# Patient Record
Sex: Female | Born: 1950
Health system: Southern US, Community
[De-identification: ages and names within clinical notes are randomized; demographics above are authoritative.]

## PROBLEM LIST (undated history)

## (undated) DIAGNOSIS — R7303 Prediabetes: Secondary | ICD-10-CM

## (undated) DIAGNOSIS — E785 Hyperlipidemia, unspecified: Secondary | ICD-10-CM

## (undated) DIAGNOSIS — M1990 Unspecified osteoarthritis, unspecified site: Secondary | ICD-10-CM

## (undated) DIAGNOSIS — Z789 Other specified health status: Secondary | ICD-10-CM

## (undated) HISTORY — DX: Unspecified osteoarthritis, unspecified site: M19.90

## (undated) HISTORY — DX: Hyperlipidemia, unspecified: E78.5

## (undated) HISTORY — DX: Prediabetes: R73.03

## (undated) HISTORY — DX: Other specified health status: Z78.9

---

## 1997-12-21 ENCOUNTER — Other Ambulatory Visit: Admission: RE | Admit: 1997-12-21 | Discharge: 1997-12-21 | Payer: Self-pay | Admitting: *Deleted

## 2003-05-23 ENCOUNTER — Encounter: Admission: RE | Admit: 2003-05-23 | Discharge: 2003-05-23 | Payer: Self-pay | Admitting: Family Medicine

## 2003-05-23 ENCOUNTER — Encounter: Payer: Self-pay | Admitting: Family Medicine

## 2003-11-07 ENCOUNTER — Ambulatory Visit (HOSPITAL_COMMUNITY): Admission: RE | Admit: 2003-11-07 | Discharge: 2003-11-07 | Payer: Self-pay | Admitting: Gastroenterology

## 2003-11-07 ENCOUNTER — Encounter (INDEPENDENT_AMBULATORY_CARE_PROVIDER_SITE_OTHER): Payer: Self-pay | Admitting: Specialist

## 2004-08-29 ENCOUNTER — Other Ambulatory Visit: Admission: RE | Admit: 2004-08-29 | Discharge: 2004-08-29 | Payer: Self-pay | Admitting: Family Medicine

## 2004-12-07 ENCOUNTER — Encounter: Admission: RE | Admit: 2004-12-07 | Discharge: 2004-12-07 | Payer: Self-pay | Admitting: Family Medicine

## 2005-01-21 ENCOUNTER — Encounter: Admission: RE | Admit: 2005-01-21 | Discharge: 2005-01-21 | Payer: Self-pay | Admitting: Emergency Medicine

## 2005-05-07 IMAGING — US US TRANSVAGINAL NON-OB
1 series · 14 of 25 positions shown · non-contrast
Comparison: none

CLINICAL DATA: 54-year-old, with post-menopausal bleeding.  Patient on hormone replacement therapy. 
 ULTRASOUND OF THE PELVIS COMPLETE AND ULTRASOUND TRANSVAGINAL NON-OB:
 The uterus measures 7.0 x 4.3 x 4.9 cm.  There is a probable fundal fibroid.  The endometrium measures approximately 4 mm which is normal in a post-menopausal person.  The right ovary measures 2.1 x 0.9 x 1.2 cm.  The left ovary measures 2.2 x 1.5 x 1.3 cm.  No free pelvic fluid collections are seen.

[Series 1: unknown · 0.23mm/px · 14 of 49 slices shown]
[im 1/49]
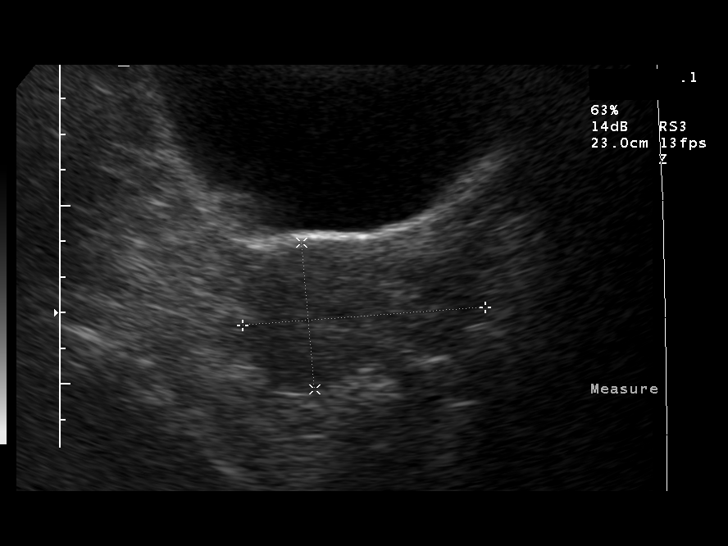
[im 5/49]
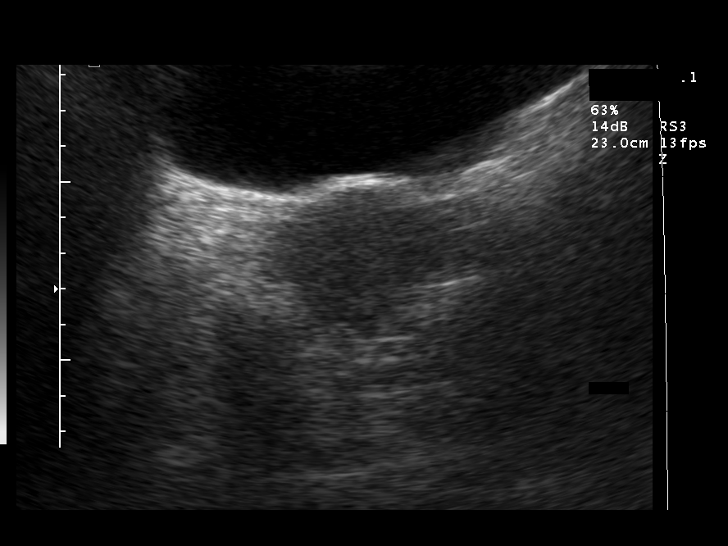
[im 9/49]
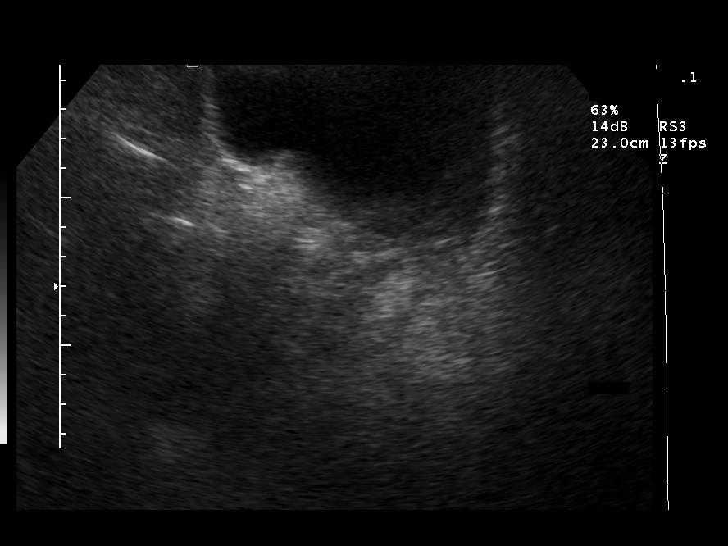
[im 13/49]
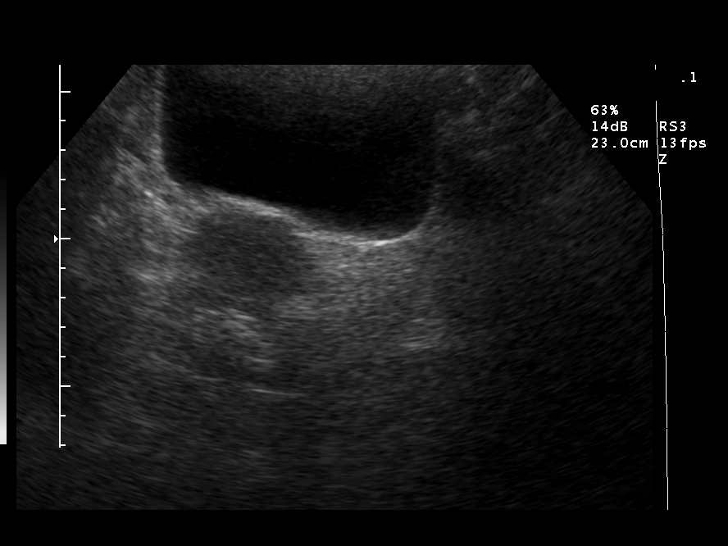
[im 17/49]
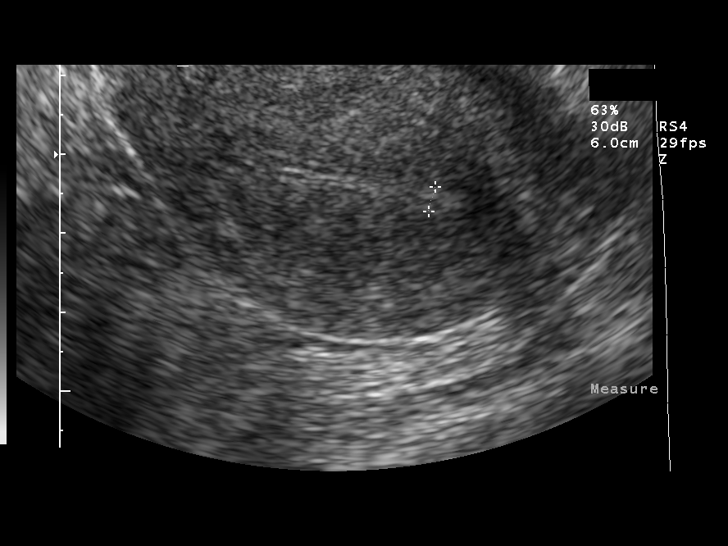
[im 19/49]
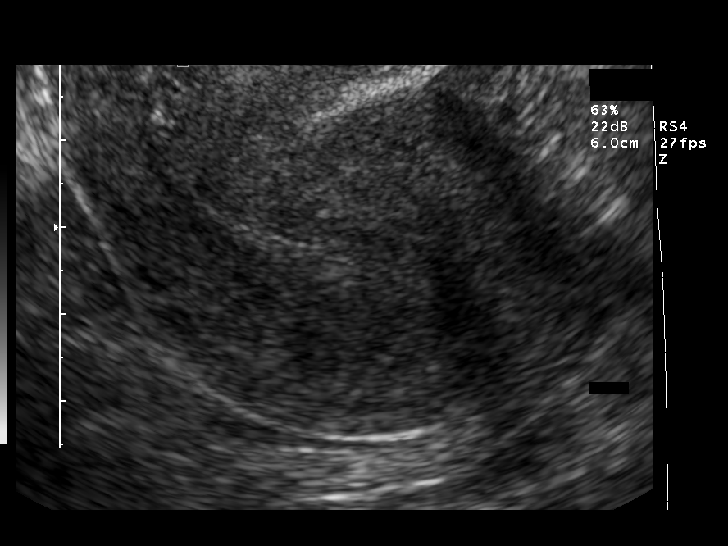
[im 23/49]
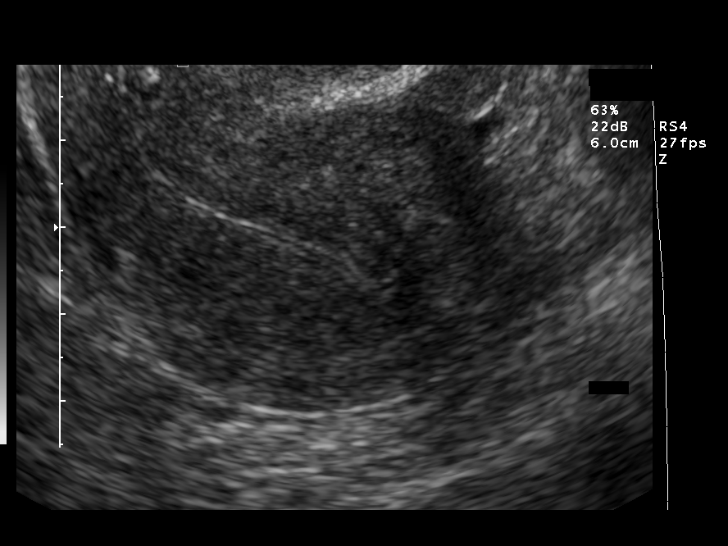
[im 27/49]
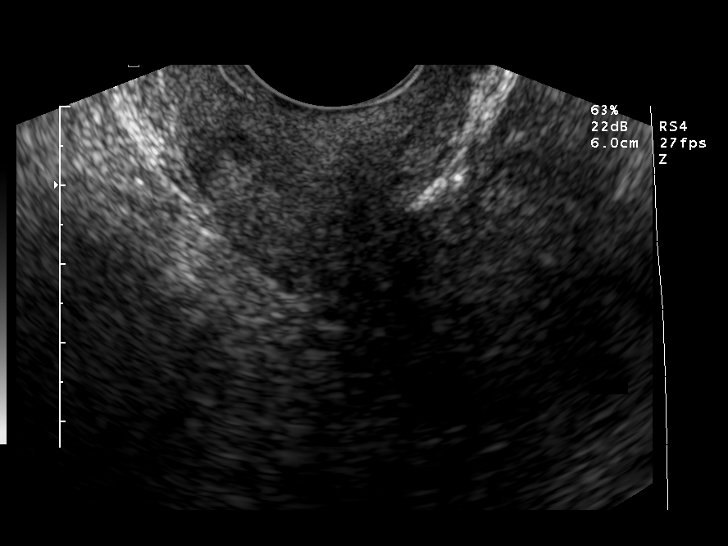
[im 31/49]
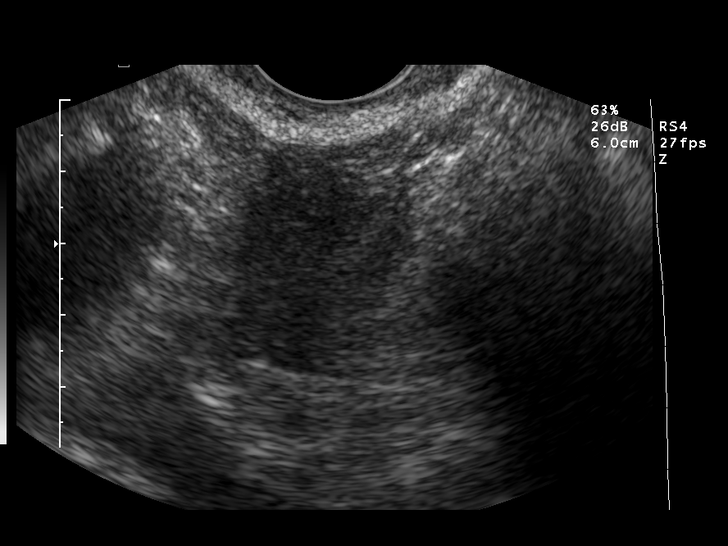
[im 33/49]
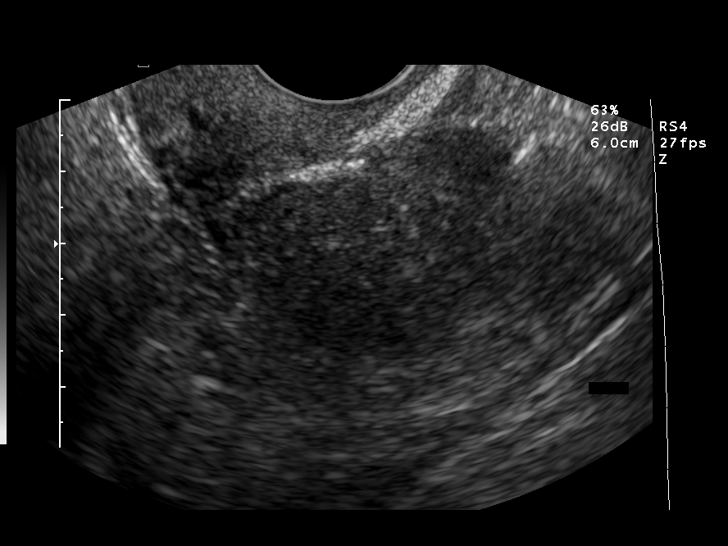
[im 37/49]
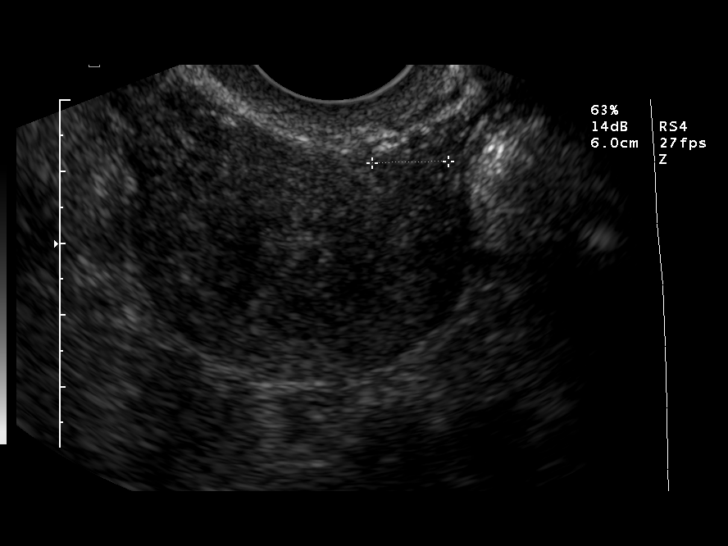
[im 41/49]
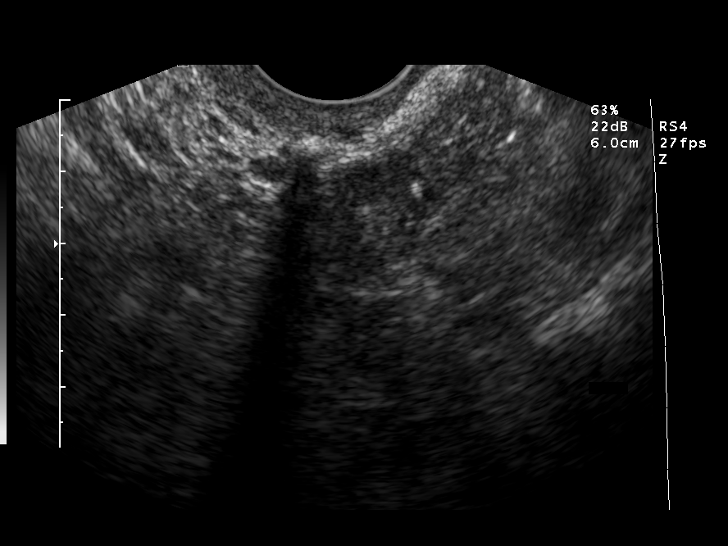
[im 45/49]
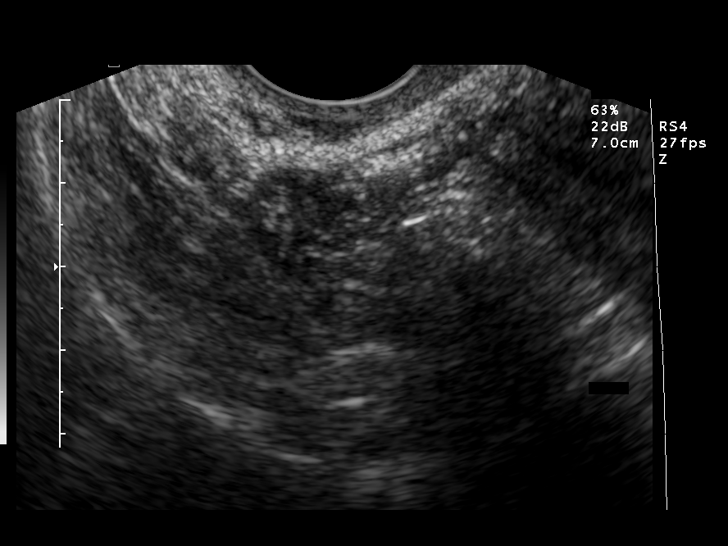
[im 49/49]
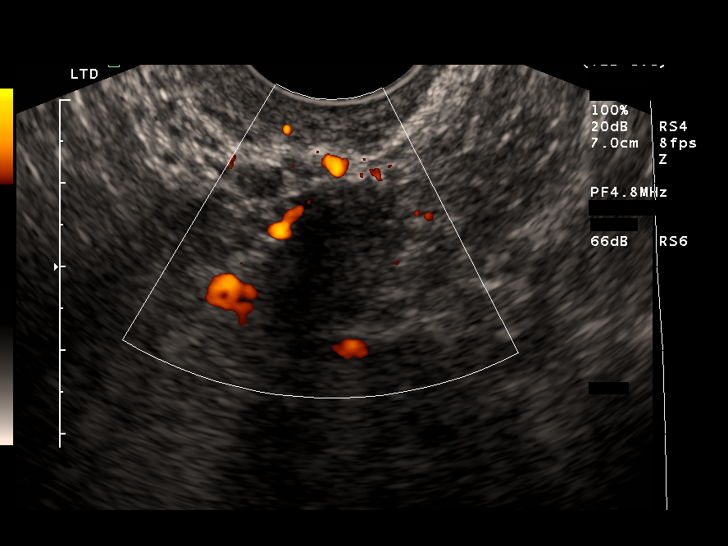

[14 of 25 positions shown; findings below may reference images not displayed]

IMPRESSION: 1.  Normal ultrasound appearance of the endometrium and normal thickness. 
 2.  Probable small fundal fibroid.
 3.  Normal sonographic appearance of the ovaries.

## 2005-06-21 IMAGING — CR DG ANKLE COMPLETE 3+V*R*
3 series · 3 of 3 positions shown · non-contrast
Comparison: none

CLINICAL DATA: Turned ankle over, pain and swelling medial ankle.
 RIGHT [id number] VIEWS:
 Three views of the right ankle reveal no evidence of fracture, dislocation or other acute bony abnormality.  Soft tissue swelling is noted in both malleolar regions, more striking on the lateral aspect.

[view not recorded (1 of 3)]
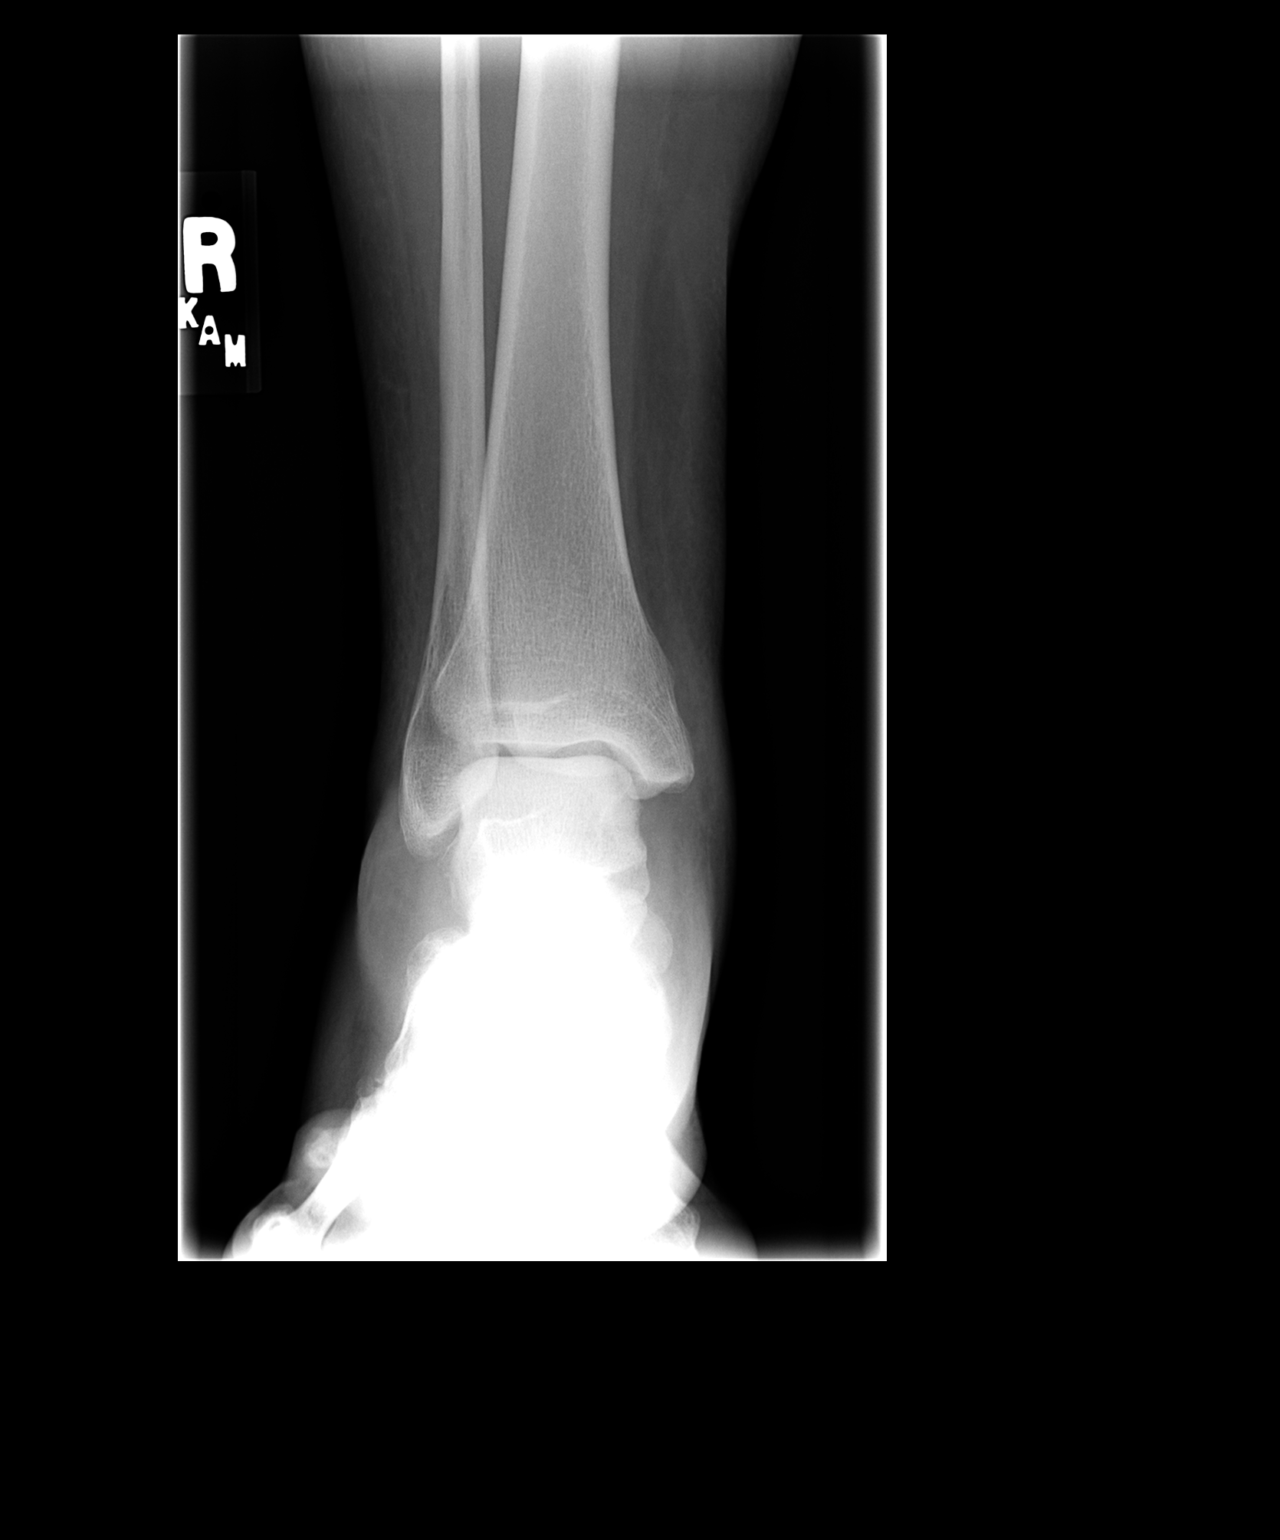

[view not recorded (2 of 3)]
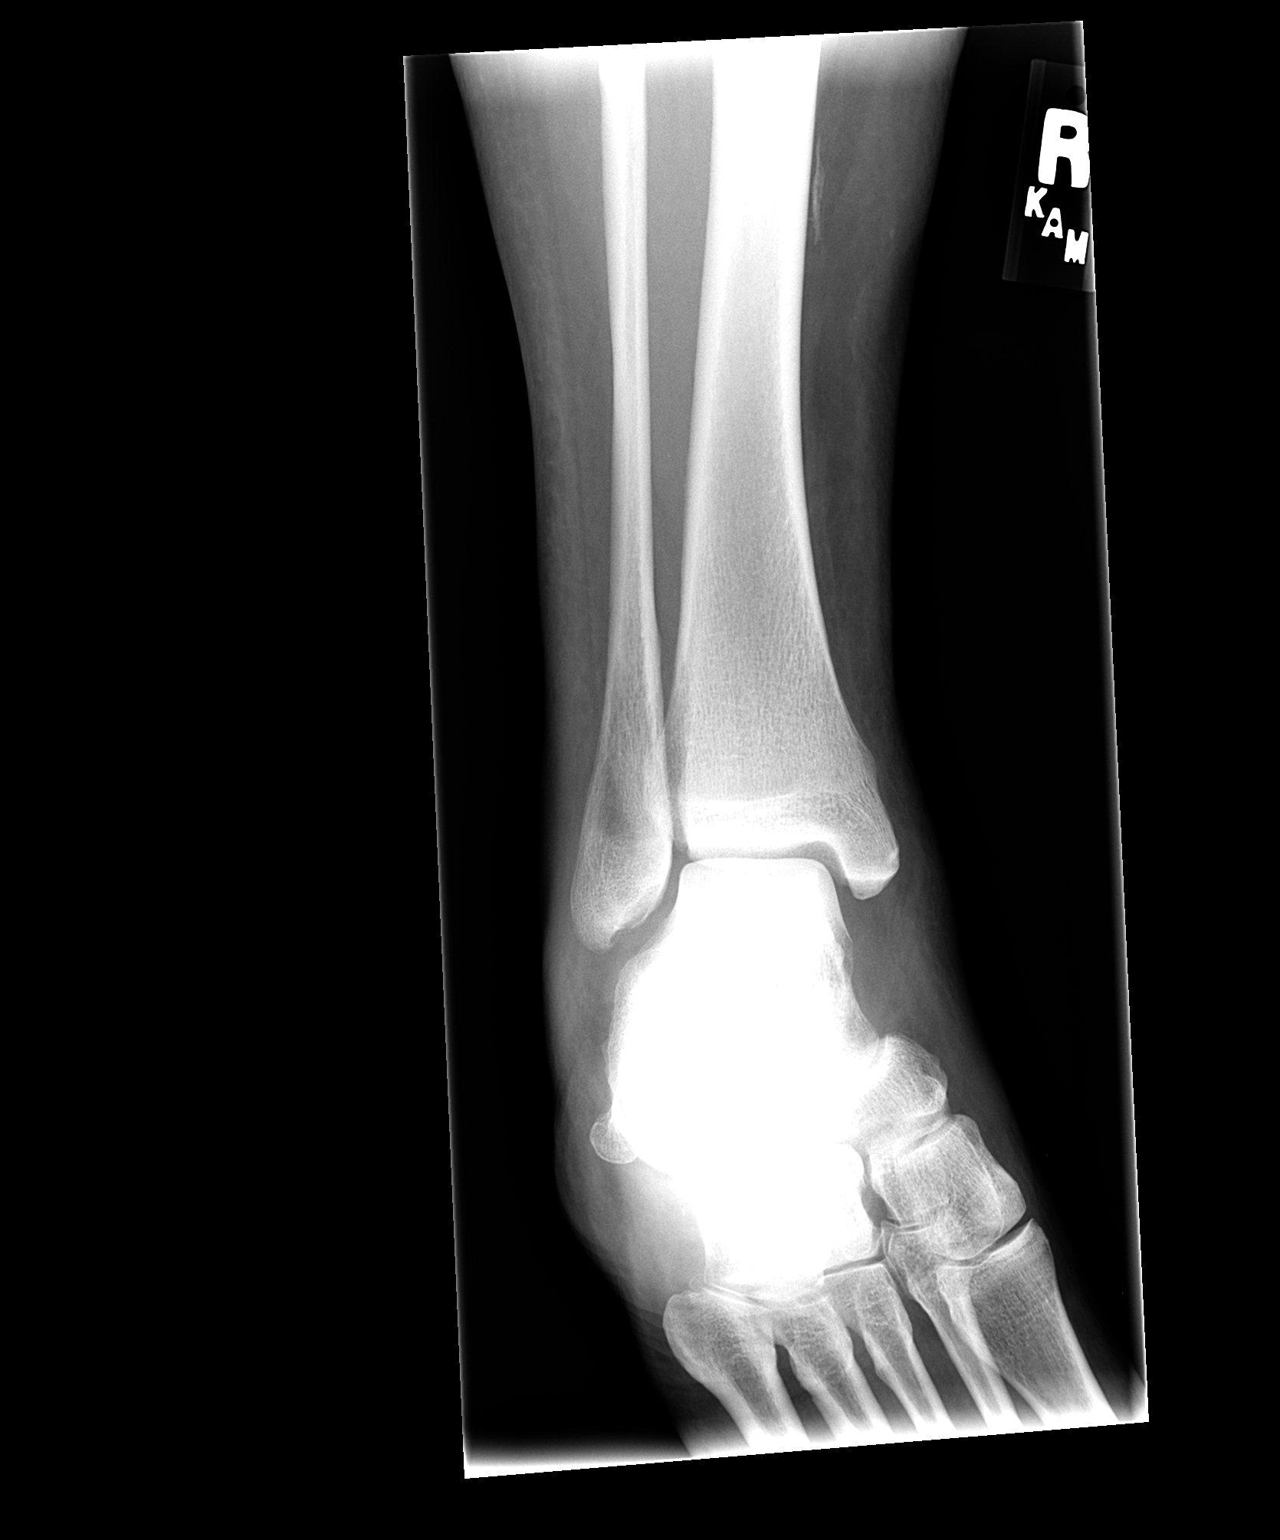

[view not recorded (3 of 3)]
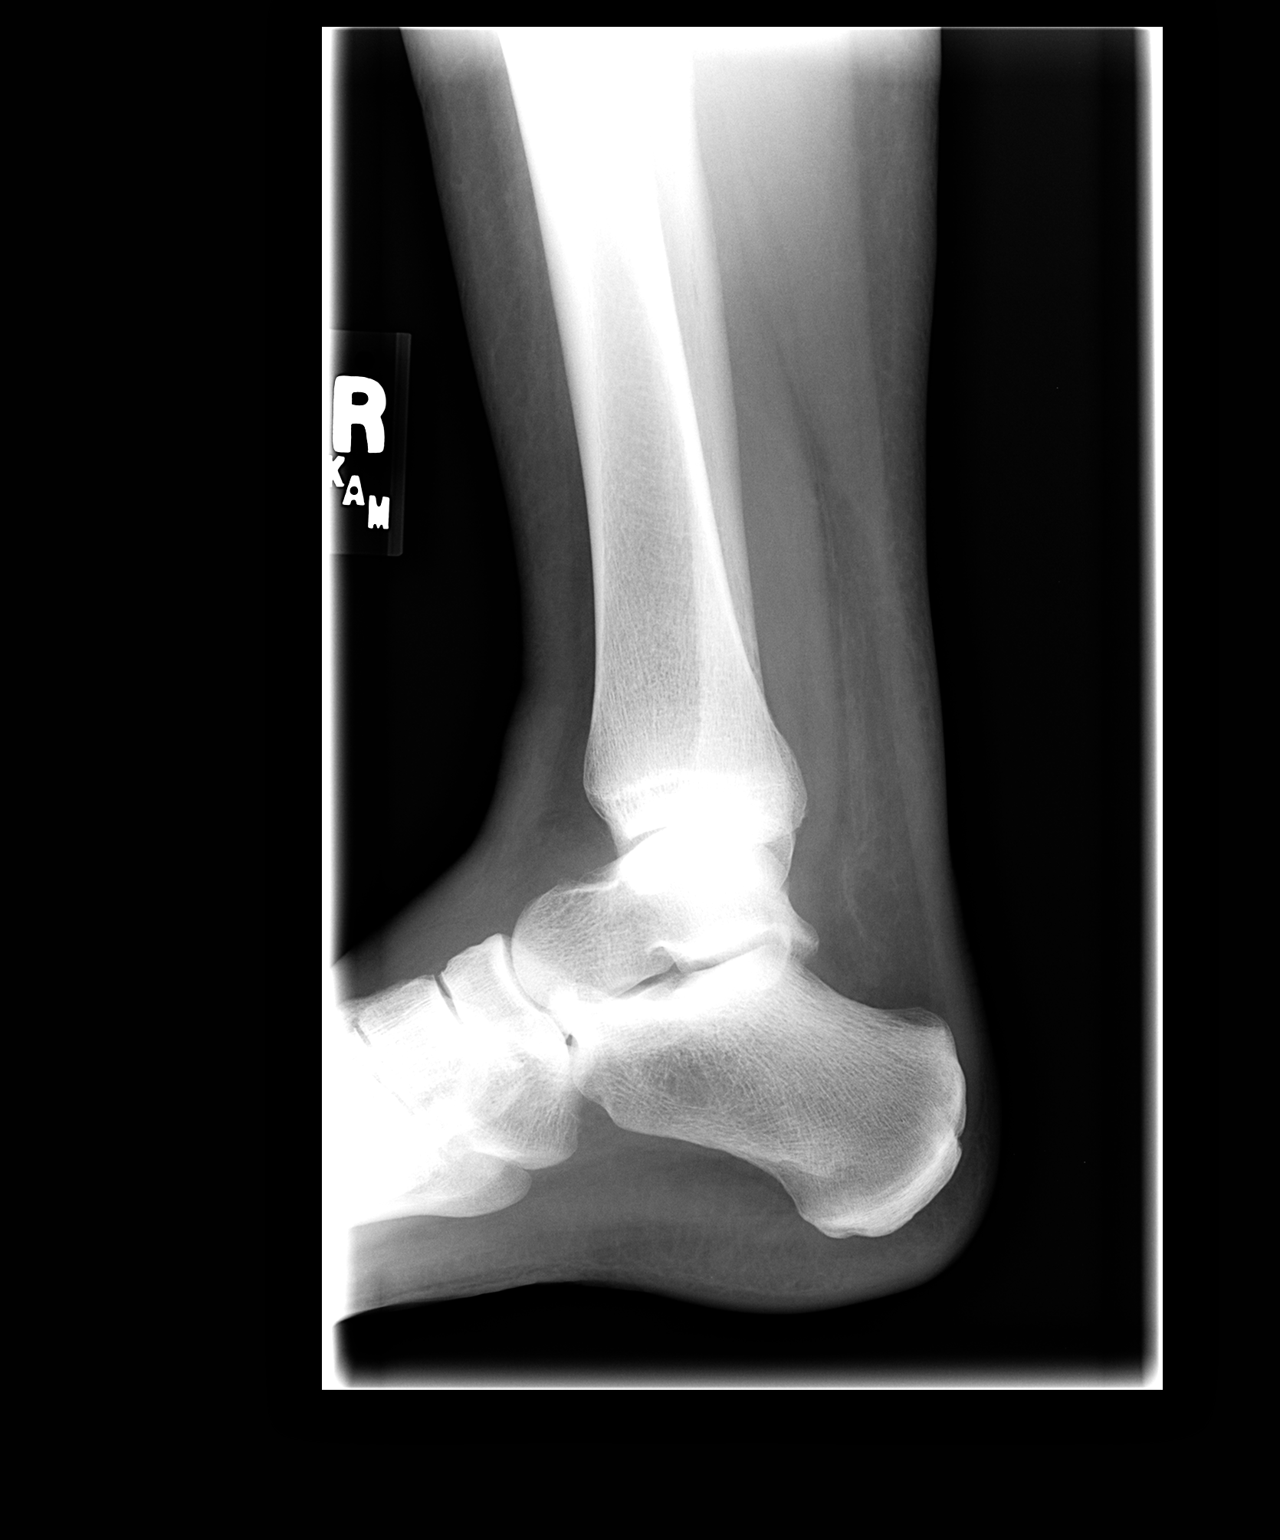

[3 of 3 positions shown; findings below may reference images not displayed]

IMPRESSION: No evidence of fracture is noted.  Soft tissue swelling is noted above.

## 2005-12-20 ENCOUNTER — Other Ambulatory Visit: Admission: RE | Admit: 2005-12-20 | Discharge: 2005-12-20 | Payer: Self-pay | Admitting: Family Medicine

## 2005-12-27 ENCOUNTER — Ambulatory Visit (HOSPITAL_COMMUNITY): Admission: RE | Admit: 2005-12-27 | Discharge: 2005-12-27 | Payer: Self-pay | Admitting: Family Medicine

## 2006-12-23 ENCOUNTER — Other Ambulatory Visit: Admission: RE | Admit: 2006-12-23 | Discharge: 2006-12-23 | Payer: Self-pay | Admitting: Family Medicine

## 2007-12-29 ENCOUNTER — Other Ambulatory Visit: Admission: RE | Admit: 2007-12-29 | Discharge: 2007-12-29 | Payer: Self-pay | Admitting: Family Medicine

## 2009-01-10 ENCOUNTER — Other Ambulatory Visit: Admission: RE | Admit: 2009-01-10 | Discharge: 2009-01-10 | Payer: Self-pay | Admitting: Family Medicine

## 2010-02-27 ENCOUNTER — Other Ambulatory Visit: Admission: RE | Admit: 2010-02-27 | Discharge: 2010-02-27 | Payer: Self-pay | Admitting: Family Medicine

## 2011-02-22 NOTE — Op Note (Signed)
NAME:  Marisa Wilson, Marisa Wilson                          ACCOUNT NO.:  [id number]   MEDICAL RECORD NO.:  [id number]                   PATIENT TYPE:  AMB   LOCATION:  ENDO                                 FACILITY:  [facility]   PHYSICIAN:  Graylin Shiver, M.D.                DATE OF BIRTH:  1951/03/14   DATE OF PROCEDURE:  11/07/2003  DATE OF DISCHARGE:                                 OPERATIVE REPORT   PROCEDURE:  Colonoscopy with biopsy.   INDICATIONS:  Screening.   Informed consent was obtained after explanation of the risks of bleeding,  infection, and perforation.   PREMEDICATION:  Fentanyl 50 mcg IV, Versed 8 mg IV.   DESCRIPTION OF PROCEDURE:  With the patient in the left lateral decubitus  position, rectal exam was performed.  No masses were felt.  The Olympus  colonoscope was inserted into the rectum and advanced around a very  tortuous, long colon to the cecum.  The patient had to be rolled on her  right side in order to achieve intubation of the cecum.  Cecal landmarks  were identified.  The cecum looked normal.  In the proximal ascending colon  there was a 4 mm sessile polyp removed with cold forceps.  The transverse  colon looked normal.  The descending colon, sigmoid, and rectum looked  normal.  She tolerated the procedure well without complications.   IMPRESSION:  Small ascending colon polyp, diagnosis code 211.3.   PLAN:  The pathology will be checked.  If this was an adenomatous polyp, I  would recommend a follow-up surveillance colonoscopy again in five years.                                               Graylin Shiver, M.D.    [name]  D:  11/07/2003  T:  11/07/2003  Job:  [id number]   cc:   Otilio Connors. Gerri Spore, M.D.  [address]  [place]  [place] [address]  Fax: [phone]

## 2012-03-06 ENCOUNTER — Other Ambulatory Visit (HOSPITAL_COMMUNITY)
Admission: RE | Admit: 2012-03-06 | Discharge: 2012-03-06 | Disposition: A | Discharge status: Home / Self Care | Payer: No Typology Code available for payment source | Source: Ambulatory Visit | Attending: Family Medicine | Admitting: Family Medicine

## 2012-03-06 ENCOUNTER — Other Ambulatory Visit: Payer: Self-pay | Admitting: Family Medicine

## 2012-03-06 DIAGNOSIS — Z124 Encounter for screening for malignant neoplasm of cervix: Secondary | ICD-10-CM | POA: Insufficient documentation

## 2012-06-16 ENCOUNTER — Ambulatory Visit (HOSPITAL_COMMUNITY)
Admission: RE | Admit: 2012-06-16 | Discharge: 2012-06-16 | Disposition: A | Discharge status: Home / Self Care | Payer: No Typology Code available for payment source | Source: Ambulatory Visit | Attending: Family Medicine | Admitting: Family Medicine

## 2012-06-16 DIAGNOSIS — Z823 Family history of stroke: Secondary | ICD-10-CM | POA: Insufficient documentation

## 2012-06-16 DIAGNOSIS — R51 Headache: Secondary | ICD-10-CM | POA: Insufficient documentation

## 2012-06-16 DIAGNOSIS — R0989 Other specified symptoms and signs involving the circulatory and respiratory systems: Secondary | ICD-10-CM

## 2012-06-16 DIAGNOSIS — I6529 Occlusion and stenosis of unspecified carotid artery: Secondary | ICD-10-CM | POA: Insufficient documentation

## 2012-06-16 NOTE — Progress Notes (Signed)
Bilateral:  No evidence of hemodynamically significant internal carotid artery stenosis.   Vertebral artery flow is antegrade.     

## 2016-03-18 ENCOUNTER — Other Ambulatory Visit: Payer: Self-pay | Admitting: Family Medicine

## 2016-03-18 ENCOUNTER — Other Ambulatory Visit (HOSPITAL_COMMUNITY)
Admission: RE | Admit: 2016-03-18 | Discharge: 2016-03-18 | Disposition: A | Discharge status: Home / Self Care | Payer: PRIVATE HEALTH INSURANCE | Source: Ambulatory Visit | Attending: Family Medicine | Admitting: Family Medicine

## 2016-03-18 DIAGNOSIS — Z01411 Encounter for gynecological examination (general) (routine) with abnormal findings: Secondary | ICD-10-CM | POA: Insufficient documentation

## 2016-03-18 DIAGNOSIS — Z1151 Encounter for screening for human papillomavirus (HPV): Secondary | ICD-10-CM | POA: Diagnosis present

## 2016-03-20 LAB — CYTOLOGY - PAP

## 2018-04-15 DIAGNOSIS — Z23 Encounter for immunization: Secondary | ICD-10-CM | POA: Diagnosis not present

## 2018-04-15 DIAGNOSIS — R69 Illness, unspecified: Secondary | ICD-10-CM | POA: Diagnosis not present

## 2018-04-15 DIAGNOSIS — Z0000 Encounter for general adult medical examination without abnormal findings: Secondary | ICD-10-CM | POA: Diagnosis not present

## 2018-04-15 DIAGNOSIS — Z5181 Encounter for therapeutic drug level monitoring: Secondary | ICD-10-CM | POA: Diagnosis not present

## 2018-04-15 DIAGNOSIS — E538 Deficiency of other specified B group vitamins: Secondary | ICD-10-CM | POA: Diagnosis not present

## 2018-04-15 DIAGNOSIS — E785 Hyperlipidemia, unspecified: Secondary | ICD-10-CM | POA: Diagnosis not present

## 2018-05-19 DIAGNOSIS — E2839 Other primary ovarian failure: Secondary | ICD-10-CM | POA: Diagnosis not present

## 2018-07-20 DIAGNOSIS — R69 Illness, unspecified: Secondary | ICD-10-CM | POA: Diagnosis not present

## 2018-09-10 DIAGNOSIS — D485 Neoplasm of uncertain behavior of skin: Secondary | ICD-10-CM | POA: Diagnosis not present

## 2018-09-10 DIAGNOSIS — L821 Other seborrheic keratosis: Secondary | ICD-10-CM | POA: Diagnosis not present

## 2018-09-10 DIAGNOSIS — L814 Other melanin hyperpigmentation: Secondary | ICD-10-CM | POA: Diagnosis not present

## 2018-09-10 DIAGNOSIS — D0471 Carcinoma in situ of skin of right lower limb, including hip: Secondary | ICD-10-CM | POA: Diagnosis not present

## 2018-09-10 DIAGNOSIS — B078 Other viral warts: Secondary | ICD-10-CM | POA: Diagnosis not present

## 2018-09-10 DIAGNOSIS — D1801 Hemangioma of skin and subcutaneous tissue: Secondary | ICD-10-CM | POA: Diagnosis not present

## 2018-09-10 DIAGNOSIS — L570 Actinic keratosis: Secondary | ICD-10-CM | POA: Diagnosis not present

## 2018-11-16 DIAGNOSIS — Z1231 Encounter for screening mammogram for malignant neoplasm of breast: Secondary | ICD-10-CM | POA: Diagnosis not present

## 2019-02-17 DIAGNOSIS — H524 Presbyopia: Secondary | ICD-10-CM | POA: Diagnosis not present

## 2019-04-22 DIAGNOSIS — D485 Neoplasm of uncertain behavior of skin: Secondary | ICD-10-CM | POA: Diagnosis not present

## 2019-04-22 DIAGNOSIS — L565 Disseminated superficial actinic porokeratosis (DSAP): Secondary | ICD-10-CM | POA: Diagnosis not present

## 2019-04-22 DIAGNOSIS — D1801 Hemangioma of skin and subcutaneous tissue: Secondary | ICD-10-CM | POA: Diagnosis not present

## 2019-04-22 DIAGNOSIS — L570 Actinic keratosis: Secondary | ICD-10-CM | POA: Diagnosis not present

## 2019-04-22 DIAGNOSIS — L920 Granuloma annulare: Secondary | ICD-10-CM | POA: Diagnosis not present

## 2019-04-22 DIAGNOSIS — D225 Melanocytic nevi of trunk: Secondary | ICD-10-CM | POA: Diagnosis not present

## 2019-04-22 DIAGNOSIS — Z85828 Personal history of other malignant neoplasm of skin: Secondary | ICD-10-CM | POA: Diagnosis not present

## 2019-04-22 DIAGNOSIS — L821 Other seborrheic keratosis: Secondary | ICD-10-CM | POA: Diagnosis not present

## 2019-05-12 DIAGNOSIS — Z5181 Encounter for therapeutic drug level monitoring: Secondary | ICD-10-CM | POA: Diagnosis not present

## 2019-05-12 DIAGNOSIS — R7303 Prediabetes: Secondary | ICD-10-CM | POA: Diagnosis not present

## 2019-05-12 DIAGNOSIS — E538 Deficiency of other specified B group vitamins: Secondary | ICD-10-CM | POA: Diagnosis not present

## 2019-05-12 DIAGNOSIS — E785 Hyperlipidemia, unspecified: Secondary | ICD-10-CM | POA: Diagnosis not present

## 2019-05-14 DIAGNOSIS — Z0000 Encounter for general adult medical examination without abnormal findings: Secondary | ICD-10-CM | POA: Diagnosis not present

## 2019-05-14 DIAGNOSIS — Z1211 Encounter for screening for malignant neoplasm of colon: Secondary | ICD-10-CM | POA: Diagnosis not present

## 2019-05-14 DIAGNOSIS — R69 Illness, unspecified: Secondary | ICD-10-CM | POA: Diagnosis not present

## 2019-06-01 DIAGNOSIS — Z23 Encounter for immunization: Secondary | ICD-10-CM | POA: Diagnosis not present

## 2019-06-13 DIAGNOSIS — L247 Irritant contact dermatitis due to plants, except food: Secondary | ICD-10-CM | POA: Diagnosis not present

## 2019-10-26 DIAGNOSIS — L738 Other specified follicular disorders: Secondary | ICD-10-CM | POA: Diagnosis not present

## 2019-10-26 DIAGNOSIS — D225 Melanocytic nevi of trunk: Secondary | ICD-10-CM | POA: Diagnosis not present

## 2019-10-26 DIAGNOSIS — D1801 Hemangioma of skin and subcutaneous tissue: Secondary | ICD-10-CM | POA: Diagnosis not present

## 2019-10-26 DIAGNOSIS — L814 Other melanin hyperpigmentation: Secondary | ICD-10-CM | POA: Diagnosis not present

## 2019-10-26 DIAGNOSIS — L570 Actinic keratosis: Secondary | ICD-10-CM | POA: Diagnosis not present

## 2019-10-26 DIAGNOSIS — L821 Other seborrheic keratosis: Secondary | ICD-10-CM | POA: Diagnosis not present

## 2019-10-26 DIAGNOSIS — D2239 Melanocytic nevi of other parts of face: Secondary | ICD-10-CM | POA: Diagnosis not present

## 2019-12-02 DIAGNOSIS — Z1231 Encounter for screening mammogram for malignant neoplasm of breast: Secondary | ICD-10-CM | POA: Diagnosis not present

## 2019-12-10 DIAGNOSIS — R922 Inconclusive mammogram: Secondary | ICD-10-CM | POA: Diagnosis not present

## 2020-02-20 DIAGNOSIS — S80861A Insect bite (nonvenomous), right lower leg, initial encounter: Secondary | ICD-10-CM | POA: Diagnosis not present

## 2020-02-20 DIAGNOSIS — W57XXXA Bitten or stung by nonvenomous insect and other nonvenomous arthropods, initial encounter: Secondary | ICD-10-CM | POA: Diagnosis not present

## 2020-02-20 DIAGNOSIS — L309 Dermatitis, unspecified: Secondary | ICD-10-CM | POA: Diagnosis not present

## 2020-04-28 ENCOUNTER — Ambulatory Visit: Payer: Self-pay | Admitting: Podiatry

## 2020-05-04 ENCOUNTER — Other Ambulatory Visit: Payer: Self-pay

## 2020-05-04 ENCOUNTER — Ambulatory Visit (INDEPENDENT_AMBULATORY_CARE_PROVIDER_SITE_OTHER): Payer: Medicare HMO

## 2020-05-04 ENCOUNTER — Encounter: Payer: Self-pay | Admitting: Podiatry

## 2020-05-04 ENCOUNTER — Ambulatory Visit: Payer: Medicare HMO | Admitting: Podiatry

## 2020-05-04 DIAGNOSIS — Q828 Other specified congenital malformations of skin: Secondary | ICD-10-CM

## 2020-05-04 DIAGNOSIS — Q667 Congenital pes cavus, unspecified foot: Secondary | ICD-10-CM

## 2020-05-04 DIAGNOSIS — M2042 Other hammer toe(s) (acquired), left foot: Secondary | ICD-10-CM | POA: Diagnosis not present

## 2020-05-04 DIAGNOSIS — M2041 Other hammer toe(s) (acquired), right foot: Secondary | ICD-10-CM

## 2020-05-04 NOTE — Progress Notes (Signed)
Subjective:  Patient ID: Marisa Wilson, female    DOB: Nov 16, 1950,  MRN: [id number]  Chief Complaint  Patient presents with  . Callouses    Painful callouses - L plantar forefoot submet 1 and 5, R plantar forefoot submet 1, 3, and 5.  . Nail Problem    Thick, long toenails. Requests nail trim.  . Foot Problem    Bilateral - pt wonders if she has hammertoes. They are not painful, but she thinks she has developed corns on R 4th toe and L 2nd toe.    69 y.o. female presents with the above complaint.  Patient presents with complaint of thickened elongated dystrophic toenails as well as benign skin lesions/porokeratosis to bilateral 1 and 5 lesions.  Patient states is painful to ambulate on.  Patient has not tried anything for management of it.  She has not seen anyone else prior to seeing me.  She would like to discuss treatment options.  She also complains of very high arch foot leading to pressure in the forefoot/ball of the foot.  Pain scale 7 out of 10.  Is dull achy in nature.   Review of Systems: Negative except as noted in the HPI. Denies N/V/F/Ch.  No past medical history on file.  Current Outpatient Medications:  .  citalopram (CELEXA) 20 MG tablet, Take 20 mg by mouth daily., Disp: , Rfl:  .  pravastatin (PRAVACHOL) 20 MG tablet, Take 20 mg by mouth daily., Disp: , Rfl:   Social History   Tobacco Use  Smoking Status Not on file    Allergies  Allergen Reactions  . Codeine Nausea And Vomiting   Objective:  There were no vitals filed for this visit. There is no height or weight on file to calculate BMI. Constitutional Well developed. Well nourished.  Vascular Dorsalis pedis pulses palpable bilaterally. Posterior tibial pulses palpable bilaterally. Capillary refill normal to all digits.  No cyanosis or clubbing noted. Pedal hair growth normal.  Neurologic Normal speech. Oriented to person, place, and time. Epicritic sensation to light touch grossly present  bilaterally.  Dermatologic Nails well groomed and normal in appearance. No open wounds. No skin lesions.  Orthopedic:  Pain on palpation to the hyperkeratotic lesions bilateral 1 and 5.  Cavus foot structure noted with plantarflexion of the metatarsals and dorsiflexion of the digits with hammertoe contractures of 2 through 5 bilaterally   Radiographs: 3 views of skeletally mature adult bilateral views: Bullet hole sinus tarsi noted.  There is increasing calcaneal inclination angle increase in talar declination angle.  Posterior break in the cyma line.  Hammertoe contractures noted bilaterally 1 through 5.  No arthritic changes noted. Assessment:   1. Hammertoes of both feet   2. Pes cavus    Plan:  Patient was evaluated and treated and all questions answered.  Porokeratosis bilateral submetatarsal 1 and 5 with underlying pes cavus foot structure -I explained to the patient the etiology of callus formation and various treatment options were extensively discussed with the patient.  Given that patient has pes cavus foot structure putting excessive pressure in the ball of the foot is the etiology of the hyperkeratotic lesions.  I explained this to the patient extensive detail.  I believe patient will benefit from custom-made orthotics to help address and support the arch of the foot control the hindfoot motion and offload the pressure sensitive callus formation to bilateral 1 and 5 and right third submetatarsal. -He will be scheduled see rec for custom-made orthotics with offloading  as described above.  Return for [employer] with Marisa Wilson for [employer].

## 2020-05-11 ENCOUNTER — Other Ambulatory Visit: Payer: Self-pay

## 2020-05-11 ENCOUNTER — Ambulatory Visit (INDEPENDENT_AMBULATORY_CARE_PROVIDER_SITE_OTHER): Payer: Medicare HMO | Admitting: Orthotics

## 2020-05-11 DIAGNOSIS — M2041 Other hammer toe(s) (acquired), right foot: Secondary | ICD-10-CM | POA: Diagnosis not present

## 2020-05-11 DIAGNOSIS — Q667 Congenital pes cavus, unspecified foot: Secondary | ICD-10-CM

## 2020-05-11 DIAGNOSIS — Q828 Other specified congenital malformations of skin: Secondary | ICD-10-CM

## 2020-05-11 DIAGNOSIS — M2042 Other hammer toe(s) (acquired), left foot: Secondary | ICD-10-CM | POA: Diagnosis not present

## 2020-05-11 NOTE — Progress Notes (Signed)
Patient came in today for evaluation/assessment custom foot orthotics.  Patient presents foot pain and discomfort associated with plantar fasciitis.  Patient has noted pes cavus foot type with also rear foot varus deformity. ...Goal is to "bring ground up" with contoured arch support, and 4 degree valgus RF and FF posting.     

## 2020-05-17 DIAGNOSIS — R7303 Prediabetes: Secondary | ICD-10-CM | POA: Diagnosis not present

## 2020-05-17 DIAGNOSIS — E785 Hyperlipidemia, unspecified: Secondary | ICD-10-CM | POA: Diagnosis not present

## 2020-05-17 DIAGNOSIS — Z79899 Other long term (current) drug therapy: Secondary | ICD-10-CM | POA: Diagnosis not present

## 2020-05-17 DIAGNOSIS — E538 Deficiency of other specified B group vitamins: Secondary | ICD-10-CM | POA: Diagnosis not present

## 2020-05-23 DIAGNOSIS — Z0000 Encounter for general adult medical examination without abnormal findings: Secondary | ICD-10-CM | POA: Diagnosis not present

## 2020-05-23 DIAGNOSIS — M1990 Unspecified osteoarthritis, unspecified site: Secondary | ICD-10-CM | POA: Diagnosis not present

## 2020-05-23 DIAGNOSIS — R69 Illness, unspecified: Secondary | ICD-10-CM | POA: Diagnosis not present

## 2020-05-23 DIAGNOSIS — E538 Deficiency of other specified B group vitamins: Secondary | ICD-10-CM | POA: Diagnosis not present

## 2020-06-01 ENCOUNTER — Other Ambulatory Visit: Payer: Self-pay

## 2020-06-01 ENCOUNTER — Ambulatory Visit: Payer: Medicare HMO | Admitting: Orthotics

## 2020-06-01 DIAGNOSIS — M2042 Other hammer toe(s) (acquired), left foot: Secondary | ICD-10-CM

## 2020-06-01 DIAGNOSIS — M2041 Other hammer toe(s) (acquired), right foot: Secondary | ICD-10-CM

## 2020-06-01 DIAGNOSIS — Q667 Congenital pes cavus, unspecified foot: Secondary | ICD-10-CM

## 2020-06-29 DIAGNOSIS — Z23 Encounter for immunization: Secondary | ICD-10-CM | POA: Diagnosis not present

## 2020-07-03 NOTE — Progress Notes (Signed)
Patient came in today to pick up custom made foot orthotics.  The goals were accomplished and the patient reported no dissatisfaction with said orthotics.  Patient was advised of breakin period and how to report any issues. 

## 2020-08-04 ENCOUNTER — Encounter: Payer: Self-pay | Admitting: Podiatry

## 2020-08-04 ENCOUNTER — Other Ambulatory Visit: Payer: Self-pay

## 2020-08-04 ENCOUNTER — Ambulatory Visit: Payer: Medicare HMO | Admitting: Podiatry

## 2020-08-04 DIAGNOSIS — M79675 Pain in left toe(s): Secondary | ICD-10-CM

## 2020-08-04 DIAGNOSIS — Q828 Other specified congenital malformations of skin: Secondary | ICD-10-CM | POA: Diagnosis not present

## 2020-08-04 DIAGNOSIS — M79674 Pain in right toe(s): Secondary | ICD-10-CM

## 2020-08-04 DIAGNOSIS — B351 Tinea unguium: Secondary | ICD-10-CM

## 2020-08-04 NOTE — Progress Notes (Signed)
  Subjective:  Patient ID: Marisa Wilson, female    DOB: 08/27/1951,  MRN: [id number]  Chief Complaint  Patient presents with  . routine foot care    nail trim/callous trim     69 y.o. female presents with the above complaint.  Patient presents with complaint of thickened elongated dystrophic toenails x10 as well as benign skin lesion/porokeratotic lesion bilateral 1 and 5 bilaterally.  She states that she would like to have them debrided down.  She states the orthotics are functioning well.  She denies any other acute complaints.   Review of Systems: Negative except as noted in the HPI. Denies N/V/F/Ch.  No past medical history on file.  Current Outpatient Medications:  .  citalopram (CELEXA) 20 MG tablet, Take 20 mg by mouth daily., Disp: , Rfl:  .  pravastatin (PRAVACHOL) 20 MG tablet, Take 20 mg by mouth daily., Disp: , Rfl:   Social History   Tobacco Use  Smoking Status Not on file    Allergies  Allergen Reactions  . Codeine Nausea And Vomiting   Objective:  There were no vitals filed for this visit. There is no height or weight on file to calculate BMI. Constitutional Well developed. Well nourished.  Vascular Dorsalis pedis pulses palpable bilaterally. Posterior tibial pulses palpable bilaterally. Capillary refill normal to all digits.  No cyanosis or clubbing noted. Pedal hair growth normal.  Neurologic Normal speech. Oriented to person, place, and time. Epicritic sensation to light touch grossly present bilaterally.  Dermatologic Nails well groomed and normal in appearance. No open wounds. No skin lesions.  Orthopedic:  Pain on palpation to the hyperkeratotic lesions bilateral 1 and 5.  Cavus foot structure noted with plantarflexion of the metatarsals and dorsiflexion of the digits with hammertoe contractures of 2 through 5 bilaterally   Radiographs: 3 views of skeletally mature adult bilateral views: Bullet hole sinus tarsi noted.  There is increasing  calcaneal inclination angle increase in talar declination angle.  Posterior break in the cyma line.  Hammertoe contractures noted bilaterally 1 through 5.  No arthritic changes noted. Assessment:   1. Porokeratosis   2. Pain due to onychomycosis of toenails of both feet    Plan:  Patient was evaluated and treated and all questions answered.  Porokeratosis bilateral submetatarsal 1 and 5 with underlying pes cavus foot structure -I explained to the patient the etiology of callus formation and various treatment options were extensively discussed with the patient.  Given that patient has pes cavus foot structure putting excessive pressure in the ball of the foot is the etiology of the hyperkeratotic lesions.  I explained this to the patient extensive detail.  I believe patient will benefit from custom-made orthotics to help address and support the arch of the foot control the hindfoot motion and offload the pressure sensitive callus formation to bilateral 1 and 5 and right third submetatarsal. -Patient has obtained orthotics and is functioning well.  No acute complaints.    No follow-ups on file.

## 2020-10-25 DIAGNOSIS — L821 Other seborrheic keratosis: Secondary | ICD-10-CM | POA: Diagnosis not present

## 2020-10-25 DIAGNOSIS — L814 Other melanin hyperpigmentation: Secondary | ICD-10-CM | POA: Diagnosis not present

## 2020-10-25 DIAGNOSIS — L570 Actinic keratosis: Secondary | ICD-10-CM | POA: Diagnosis not present

## 2020-10-25 DIAGNOSIS — D225 Melanocytic nevi of trunk: Secondary | ICD-10-CM | POA: Diagnosis not present

## 2020-10-25 DIAGNOSIS — D1801 Hemangioma of skin and subcutaneous tissue: Secondary | ICD-10-CM | POA: Diagnosis not present

## 2020-10-25 DIAGNOSIS — D692 Other nonthrombocytopenic purpura: Secondary | ICD-10-CM | POA: Diagnosis not present

## 2020-11-08 ENCOUNTER — Ambulatory Visit: Payer: Medicare HMO | Admitting: Podiatry

## 2020-11-08 ENCOUNTER — Other Ambulatory Visit: Payer: Self-pay

## 2020-11-08 DIAGNOSIS — B351 Tinea unguium: Secondary | ICD-10-CM

## 2020-11-08 DIAGNOSIS — M79675 Pain in left toe(s): Secondary | ICD-10-CM | POA: Diagnosis not present

## 2020-11-08 DIAGNOSIS — M79674 Pain in right toe(s): Secondary | ICD-10-CM | POA: Diagnosis not present

## 2020-11-14 ENCOUNTER — Encounter: Payer: Self-pay | Admitting: Podiatry

## 2020-11-14 NOTE — Progress Notes (Signed)
  Subjective:  Patient ID: ATLAS KUC, female    DOB: 1950/12/28,  MRN: [id number]  Chief Complaint  Patient presents with  . Nail Problem    Routine foot care- 3 month    70 y.o. female returns for the above complaint.  Patient presents with thickened elongated dystrophic toenails x10.  Patient states it mildly painful to touch.  She would like to have them debrided down.  She does not have any history of diabetes.  She denies any other acute complaints  Objective:  There were no vitals filed for this visit. Podiatric Exam: Vascular: dorsalis pedis and posterior tibial pulses are palpable bilateral. Capillary return is immediate. Temperature gradient is WNL. Skin turgor WNL  Sensorium: Normal Semmes Weinstein monofilament test. Normal tactile sensation bilaterally. Nail Exam: Pt has thick disfigured discolored nails with subungual debris noted bilateral entire nail hallux through fifth toenails.  Pain on palpation to the nails. Ulcer Exam: There is no evidence of ulcer or pre-ulcerative changes or infection. Orthopedic Exam: Muscle tone and strength are WNL. No limitations in general ROM. No crepitus or effusions noted. HAV  B/L.  Hammer toes 2-5  B/L. Skin: No Porokeratosis. No infection or ulcers    Assessment & Plan:   1. Pain due to onychomycosis of toenails of both feet     Patient was evaluated and treated and all questions answered.  Onychomycosis with pain  -Nails palliatively debrided as below. -Educated on self-care  Procedure: Nail Debridement Rationale: pain  Type of Debridement: manual, sharp debridement. Instrumentation: Nail nipper, rotary burr. Number of Nails: 10  Procedures and Treatment: Consent by patient was obtained for treatment procedures. The patient understood the discussion of treatment and procedures well. All questions were answered thoroughly reviewed. Debridement of mycotic and hypertrophic toenails, 1 through 5 bilateral and clearing of  subungual debris. No ulceration, no infection noted.  Return Visit-Office Procedure: Patient instructed to return to the office for a follow up visit 3 months for continued evaluation and treatment.  Boneta Lucks, DPM    No follow-ups on file.

## 2020-12-07 DIAGNOSIS — Z1231 Encounter for screening mammogram for malignant neoplasm of breast: Secondary | ICD-10-CM | POA: Diagnosis not present

## 2021-02-07 ENCOUNTER — Other Ambulatory Visit: Payer: Self-pay

## 2021-02-07 ENCOUNTER — Ambulatory Visit: Payer: Medicare HMO | Admitting: Podiatry

## 2021-02-07 DIAGNOSIS — B351 Tinea unguium: Secondary | ICD-10-CM | POA: Diagnosis not present

## 2021-02-07 DIAGNOSIS — M79675 Pain in left toe(s): Secondary | ICD-10-CM

## 2021-02-07 DIAGNOSIS — M79674 Pain in right toe(s): Secondary | ICD-10-CM

## 2021-02-09 ENCOUNTER — Encounter: Payer: Self-pay | Admitting: Podiatry

## 2021-02-09 NOTE — Progress Notes (Signed)
  Subjective:  Patient ID: Marisa Wilson, female    DOB: [birth year],  MRN: [id number]  Chief Complaint  Patient presents with  . Nail Problem    Nail trim    70 y.o. female returns for the above complaint.  Patient presents with thickened elongated dystrophic toenails x10.  Patient states it mildly painful to touch.  She would like to have them debrided down.  She does not have any history of diabetes.  She denies any other acute complaints  Objective:  There were no vitals filed for this visit. Podiatric Exam: Vascular: dorsalis pedis and posterior tibial pulses are palpable bilateral. Capillary return is immediate. Temperature gradient is WNL. Skin turgor WNL  Sensorium: Normal Semmes Weinstein monofilament test. Normal tactile sensation bilaterally. Nail Exam: Pt has thick disfigured discolored nails with subungual debris noted bilateral entire nail hallux through fifth toenails.  Pain on palpation to the nails. Ulcer Exam: There is no evidence of ulcer or pre-ulcerative changes or infection. Orthopedic Exam: Muscle tone and strength are WNL. No limitations in general ROM. No crepitus or effusions noted. HAV  B/L.  Hammer toes 2-5  B/L. Skin: No Porokeratosis. No infection or ulcers    Assessment & Plan:   1. Pain due to onychomycosis of toenails of both feet     Patient was evaluated and treated and all questions answered.  Onychomycosis with pain  -Nails palliatively debrided as below. -Educated on self-care  Procedure: Nail Debridement Rationale: pain  Type of Debridement: manual, sharp debridement. Instrumentation: Nail nipper, rotary burr. Number of Nails: 10  Procedures and Treatment: Consent by patient was obtained for treatment procedures. The patient understood the discussion of treatment and procedures well. All questions were answered thoroughly reviewed. Debridement of mycotic and hypertrophic toenails, 1 through 5 bilateral and clearing of subungual debris. No  ulceration, no infection noted.  Return Visit-Office Procedure: Patient instructed to return to the office for a follow up visit 3 months for continued evaluation and treatment.  Boneta Lucks, DPM    No follow-ups on file.

## 2021-03-06 DIAGNOSIS — Z0100 Encounter for examination of eyes and vision without abnormal findings: Secondary | ICD-10-CM | POA: Diagnosis not present

## 2021-05-11 ENCOUNTER — Ambulatory Visit: Payer: Medicare HMO | Admitting: Podiatry

## 2021-06-01 DIAGNOSIS — R7303 Prediabetes: Secondary | ICD-10-CM | POA: Diagnosis not present

## 2021-06-01 DIAGNOSIS — E785 Hyperlipidemia, unspecified: Secondary | ICD-10-CM | POA: Diagnosis not present

## 2021-06-01 DIAGNOSIS — Z5181 Encounter for therapeutic drug level monitoring: Secondary | ICD-10-CM | POA: Diagnosis not present

## 2021-06-01 DIAGNOSIS — E538 Deficiency of other specified B group vitamins: Secondary | ICD-10-CM | POA: Diagnosis not present

## 2021-06-05 DIAGNOSIS — F3341 Major depressive disorder, recurrent, in partial remission: Secondary | ICD-10-CM | POA: Diagnosis not present

## 2021-06-05 DIAGNOSIS — Z0000 Encounter for general adult medical examination without abnormal findings: Secondary | ICD-10-CM | POA: Diagnosis not present

## 2021-06-05 DIAGNOSIS — R69 Illness, unspecified: Secondary | ICD-10-CM | POA: Diagnosis not present

## 2021-06-05 DIAGNOSIS — R7303 Prediabetes: Secondary | ICD-10-CM | POA: Diagnosis not present

## 2021-06-05 DIAGNOSIS — E785 Hyperlipidemia, unspecified: Secondary | ICD-10-CM | POA: Diagnosis not present

## 2021-06-06 ENCOUNTER — Other Ambulatory Visit: Payer: Self-pay | Admitting: Family Medicine

## 2021-06-06 DIAGNOSIS — E2839 Other primary ovarian failure: Secondary | ICD-10-CM

## 2021-06-14 DIAGNOSIS — Z23 Encounter for immunization: Secondary | ICD-10-CM | POA: Diagnosis not present

## 2021-06-26 ENCOUNTER — Ambulatory Visit
Admission: RE | Admit: 2021-06-26 | Discharge: 2021-06-26 | Disposition: A | Discharge status: Home / Self Care | Payer: Medicare HMO | Source: Ambulatory Visit | Attending: Family Medicine | Admitting: Family Medicine

## 2021-06-26 ENCOUNTER — Other Ambulatory Visit: Payer: Self-pay

## 2021-06-26 DIAGNOSIS — Z780 Asymptomatic menopausal state: Secondary | ICD-10-CM | POA: Diagnosis not present

## 2021-06-26 DIAGNOSIS — E2839 Other primary ovarian failure: Secondary | ICD-10-CM

## 2021-06-26 DIAGNOSIS — M85832 Other specified disorders of bone density and structure, left forearm: Secondary | ICD-10-CM | POA: Diagnosis not present

## 2021-10-19 ENCOUNTER — Ambulatory Visit: Payer: Medicare HMO | Admitting: Podiatry

## 2021-10-19 ENCOUNTER — Other Ambulatory Visit: Payer: Self-pay

## 2021-10-19 DIAGNOSIS — Q828 Other specified congenital malformations of skin: Secondary | ICD-10-CM | POA: Diagnosis not present

## 2021-10-23 NOTE — Progress Notes (Signed)
°  Subjective:  Patient ID: Marisa Wilson, female    DOB: 05-16-1951,  MRN: [id number]  Chief Complaint  Patient presents with   Callouses    Painful place on the side of left foot causing discomfort     71 y.o. female presents with the above complaint.  Patient presents for follow-up of left submetatarsal 5 porokeratotic lesion/benign skin lesion.  Is been pain for about a month as progressive gotten worse.  Debridement does help.  She states that she was doing much better however it started hurting her again.  She denies any other acute complaints   Review of Systems: Negative except as noted in the HPI. Denies N/V/F/Ch.  No past medical history on file.  Current Outpatient Medications:    citalopram (CELEXA) 20 MG tablet, Take 20 mg by mouth daily., Disp: , Rfl:    pravastatin (PRAVACHOL) 20 MG tablet, Take 20 mg by mouth daily., Disp: , Rfl:   Social History   Tobacco Use  Smoking Status Not on file  Smokeless Tobacco Not on file    Allergies  Allergen Reactions   Codeine Nausea And Vomiting   Objective:  There were no vitals filed for this visit. There is no height or weight on file to calculate BMI. Constitutional Well developed. Well nourished.  Vascular Dorsalis pedis pulses palpable bilaterally. Posterior tibial pulses palpable bilaterally. Capillary refill normal to all digits.  No cyanosis or clubbing noted. Pedal hair growth normal.  Neurologic Normal speech. Oriented to person, place, and time. Epicritic sensation to light touch grossly present bilaterally.  Dermatologic Nails well groomed and normal in appearance. No open wounds. No skin lesions.  Orthopedic:  Pain on palpation to the hyperkeratotic lesions left 5.  Cavus foot structure noted with plantarflexion of the metatarsals and dorsiflexion of the digits with hammertoe contractures of 2 through 5 bilaterally   Radiographs: None Assessment:   1. Porokeratosis     Plan:  Patient was evaluated  and treated and all questions answered.  Porokeratosis left submetatarsal 5 porokeratosis/benign skin lesion -All questions and concerns were discussed with the patient in extensive detail -As a courtesy using chisel blade to handle the lesion was debrided down to healthy striated tissue. -I discussed with her that she will benefit from custom-made orthotics to take the pressure off of this versus floating osteotomy.  She would like to discuss and think about the options and she will get back to me when she is ready.  No follow-ups on file.

## 2021-10-26 DIAGNOSIS — D1801 Hemangioma of skin and subcutaneous tissue: Secondary | ICD-10-CM | POA: Diagnosis not present

## 2021-10-26 DIAGNOSIS — L814 Other melanin hyperpigmentation: Secondary | ICD-10-CM | POA: Diagnosis not present

## 2021-10-26 DIAGNOSIS — L570 Actinic keratosis: Secondary | ICD-10-CM | POA: Diagnosis not present

## 2021-12-14 DIAGNOSIS — Z1231 Encounter for screening mammogram for malignant neoplasm of breast: Secondary | ICD-10-CM | POA: Diagnosis not present

## 2021-12-20 DIAGNOSIS — Z86010 Personal history of colonic polyps: Secondary | ICD-10-CM | POA: Diagnosis not present

## 2021-12-20 DIAGNOSIS — D128 Benign neoplasm of rectum: Secondary | ICD-10-CM | POA: Diagnosis not present

## 2021-12-20 DIAGNOSIS — D122 Benign neoplasm of ascending colon: Secondary | ICD-10-CM | POA: Diagnosis not present

## 2021-12-20 DIAGNOSIS — D120 Benign neoplasm of cecum: Secondary | ICD-10-CM | POA: Diagnosis not present

## 2021-12-25 DIAGNOSIS — D128 Benign neoplasm of rectum: Secondary | ICD-10-CM | POA: Diagnosis not present

## 2021-12-25 DIAGNOSIS — D122 Benign neoplasm of ascending colon: Secondary | ICD-10-CM | POA: Diagnosis not present

## 2022-05-08 DIAGNOSIS — M7061 Trochanteric bursitis, right hip: Secondary | ICD-10-CM | POA: Diagnosis not present

## 2022-05-08 DIAGNOSIS — M25551 Pain in right hip: Secondary | ICD-10-CM | POA: Diagnosis not present

## 2022-06-14 DIAGNOSIS — Z5181 Encounter for therapeutic drug level monitoring: Secondary | ICD-10-CM | POA: Diagnosis not present

## 2022-06-14 DIAGNOSIS — Z823 Family history of stroke: Secondary | ICD-10-CM | POA: Diagnosis not present

## 2022-06-14 DIAGNOSIS — E785 Hyperlipidemia, unspecified: Secondary | ICD-10-CM | POA: Diagnosis not present

## 2022-06-14 DIAGNOSIS — H269 Unspecified cataract: Secondary | ICD-10-CM | POA: Diagnosis not present

## 2022-06-14 DIAGNOSIS — E538 Deficiency of other specified B group vitamins: Secondary | ICD-10-CM | POA: Diagnosis not present

## 2022-06-14 DIAGNOSIS — R69 Illness, unspecified: Secondary | ICD-10-CM | POA: Diagnosis not present

## 2022-06-14 DIAGNOSIS — F325 Major depressive disorder, single episode, in full remission: Secondary | ICD-10-CM | POA: Diagnosis not present

## 2022-06-14 DIAGNOSIS — R7303 Prediabetes: Secondary | ICD-10-CM | POA: Diagnosis not present

## 2022-06-14 DIAGNOSIS — K219 Gastro-esophageal reflux disease without esophagitis: Secondary | ICD-10-CM | POA: Diagnosis not present

## 2022-06-14 DIAGNOSIS — R030 Elevated blood-pressure reading, without diagnosis of hypertension: Secondary | ICD-10-CM | POA: Diagnosis not present

## 2022-06-14 DIAGNOSIS — Z809 Family history of malignant neoplasm, unspecified: Secondary | ICD-10-CM | POA: Diagnosis not present

## 2022-06-14 DIAGNOSIS — Z85828 Personal history of other malignant neoplasm of skin: Secondary | ICD-10-CM | POA: Diagnosis not present

## 2022-06-14 DIAGNOSIS — Z604 Social exclusion and rejection: Secondary | ICD-10-CM | POA: Diagnosis not present

## 2022-06-14 DIAGNOSIS — F419 Anxiety disorder, unspecified: Secondary | ICD-10-CM | POA: Diagnosis not present

## 2022-06-14 DIAGNOSIS — M1990 Unspecified osteoarthritis, unspecified site: Secondary | ICD-10-CM | POA: Diagnosis not present

## 2022-06-19 DIAGNOSIS — R42 Dizziness and giddiness: Secondary | ICD-10-CM | POA: Diagnosis not present

## 2022-06-19 DIAGNOSIS — R69 Illness, unspecified: Secondary | ICD-10-CM | POA: Diagnosis not present

## 2022-06-19 DIAGNOSIS — E785 Hyperlipidemia, unspecified: Secondary | ICD-10-CM | POA: Diagnosis not present

## 2022-06-19 DIAGNOSIS — Z823 Family history of stroke: Secondary | ICD-10-CM | POA: Diagnosis not present

## 2022-06-19 DIAGNOSIS — R7303 Prediabetes: Secondary | ICD-10-CM | POA: Diagnosis not present

## 2022-06-19 DIAGNOSIS — Z23 Encounter for immunization: Secondary | ICD-10-CM | POA: Diagnosis not present

## 2022-06-19 DIAGNOSIS — M25551 Pain in right hip: Secondary | ICD-10-CM | POA: Diagnosis not present

## 2022-06-19 DIAGNOSIS — Z0000 Encounter for general adult medical examination without abnormal findings: Secondary | ICD-10-CM | POA: Diagnosis not present

## 2022-06-21 ENCOUNTER — Other Ambulatory Visit: Payer: Self-pay | Admitting: Family Medicine

## 2022-06-21 DIAGNOSIS — R42 Dizziness and giddiness: Secondary | ICD-10-CM

## 2022-06-21 DIAGNOSIS — Z823 Family history of stroke: Secondary | ICD-10-CM

## 2022-06-27 ENCOUNTER — Ambulatory Visit
Admission: RE | Admit: 2022-06-27 | Discharge: 2022-06-27 | Disposition: A | Discharge status: Home / Self Care | Payer: Medicare HMO | Source: Ambulatory Visit | Attending: Family Medicine | Admitting: Family Medicine

## 2022-06-27 DIAGNOSIS — R42 Dizziness and giddiness: Secondary | ICD-10-CM

## 2022-06-27 DIAGNOSIS — Z823 Family history of stroke: Secondary | ICD-10-CM

## 2022-07-02 NOTE — Therapy (Signed)
OUTPATIENT PHYSICAL THERAPY LOWER EXTREMITY EVALUATION   Patient Name: Marisa Wilson MRN: [id number] DOB:[birth year], 71 y.o., female Today's Date: 07/03/2022    No past medical history on file. No past surgical history on file. There are no problems to display for this patient.   PCP: Maurice Small MD  REFERRING PROVIDER: Windy Carina FNP  REFERRING DIAG: M25.551 pain in right hip  THERAPY DIAG:  Right hip pain; weakness  Rationale for Evaluation and Treatment Rehabilitation  ONSET DATE: ***  SUBJECTIVE:   SUBJECTIVE STATEMENT: ***  PERTINENT HISTORY: Recently joined a gym  PAIN:  Are you having pain? Yes NPRS scale: ***/10 Pain location: ***   Aggravating factors: *** Relieving factors: ***   PRECAUTIONS: None  WEIGHT BEARING RESTRICTIONS No  FALLS:  Has patient fallen in last 6 months? {fallsyesno:27318}  LIVING ENVIRONMENT: Lives with: {[facility] lives with:25569::"lives with their family"} Lives in: {Lives in:25570} Stairs: {opstairs:27293} OCCUPATION: ***  PLOF: {PLOF:24004}  PATIENT GOALS ***   OBJECTIVE:   DIAGNOSTIC FINDINGS: ***  PATIENT SURVEYS:  FOTO ***  COGNITION:  Overall cognitive status: Within functional limits for tasks assessed      MUSCLE LENGTH: Hamstrings: Right *** deg; Left *** deg Thomas test: Right *** deg; Left *** deg  POSTURE: {posture:25561}  PALPATION: ***  LOWER EXTREMITY ROM:  Active ROM Right eval Left eval  Hip flexion    Hip extension    Hip abduction    Hip adduction    Hip internal rotation    Hip external rotation    Knee flexion    Knee extension    Ankle dorsiflexion    Ankle plantarflexion    Ankle inversion    Ankle eversion     (Blank rows = not tested)  LOWER EXTREMITY MMT:  MMT Right eval Left eval  Hip flexion    Hip extension    Hip abduction    Hip adduction    Hip internal rotation    Hip external rotation    Knee flexion    Knee extension    Ankle dorsiflexion     Ankle plantarflexion    Ankle inversion    Ankle eversion     (Blank rows = not tested)  LOWER EXTREMITY SPECIAL TESTS:  {LEspecialtests:26242}  FUNCTIONAL TESTS:  {Functional tests:24029}  GAIT:  Comments: ***    TODAY'S TREATMENT: ***   PATIENT EDUCATION:  Education details: *** Person educated: {Person educated:25204} Education method: {Education Method:25205} Education comprehension: {Education Comprehension:25206}   HOME EXERCISE PROGRAM: ***  ASSESSMENT:  CLINICAL IMPRESSION: Patient is a 71 y.o. female who was seen today for physical therapy evaluation and treatment for right hip pain.      OBJECTIVE IMPAIRMENTS {opptimpairments:25111}.   ACTIVITY LIMITATIONS {activitylimitations:27494}  PARTICIPATION LIMITATIONS: {participationrestrictions:25113}  PERSONAL FACTORS {Personal factors:25162} are also affecting patient's functional outcome.   REHAB POTENTIAL: Good  CLINICAL DECISION MAKING: Stable/uncomplicated  EVALUATION COMPLEXITY: Low   GOALS: Goals reviewed with patient? Yes  SHORT TERM GOALS: Target date: {follow up:25551}  The patient will demonstrate knowledge of basic self care strategies and exercises to promote healing   Baseline: Goal status: INITIAL  2.  The patient will report a 40% improvement in pain levels with functional activities which are currently difficult including    Baseline:  Goal status: INITIAL  3.  The patient will have improved hip strength to at least 4/5 needed for standing, walking longer distances and descending stairs at home and in the community  Baseline:  Goal status: INITIAL  4.  *** Baseline:  Goal status: INITIAL  5.  *** Baseline:  Goal status: INITIAL   LONG TERM GOALS: Target date: {follow up:25551}   The patient will be independent in a safe self progression of a home exercise program to promote further recovery of function   Baseline:  Goal status: INITIAL  2.  The patient will  report a 75% improvement in pain levels with functional activities which are currently difficult   Baseline:  Goal status: INITIAL  3.  The patient will have improved hip strength to at least 4+/5 needed for standing, walking longer distances and descending stairs at home and in the community  Baseline:  Goal status: INITIAL  4.  *** Baseline:  Goal status: INITIAL  5.  The patient will have improved FOTO score to       indicating improved function with less pain  Baseline:  Goal status: INITIAL     PLAN: PT FREQUENCY: 2x/week  PT DURATION: 8 weeks  PLANNED INTERVENTIONS: Therapeutic exercises, Therapeutic activity, Neuromuscular re-education, Balance training, Gait training, Patient/Family education, Self Care, Joint mobilization, Aquatic Therapy, Dry Needling, Cryotherapy, Moist heat, Taping, Ultrasound, Ionotophoresis '4mg'$ /ml Dexamethasone, Manual therapy, and Re-evaluation  PLAN FOR NEXT SESSION: ***

## 2022-07-03 ENCOUNTER — Encounter: Payer: Self-pay | Admitting: Physical Therapy

## 2022-07-03 ENCOUNTER — Other Ambulatory Visit: Payer: Self-pay

## 2022-07-03 ENCOUNTER — Ambulatory Visit: Payer: Medicare HMO | Attending: Family Medicine | Admitting: Physical Therapy

## 2022-07-03 DIAGNOSIS — M25551 Pain in right hip: Secondary | ICD-10-CM | POA: Insufficient documentation

## 2022-07-03 DIAGNOSIS — M6281 Muscle weakness (generalized): Secondary | ICD-10-CM | POA: Diagnosis not present

## 2022-07-05 ENCOUNTER — Ambulatory Visit: Payer: Medicare HMO | Admitting: Rehabilitative and Restorative Service Providers"

## 2022-07-08 ENCOUNTER — Ambulatory Visit: Payer: Medicare HMO | Attending: Family Medicine | Admitting: Rehabilitative and Restorative Service Providers"

## 2022-07-08 ENCOUNTER — Encounter: Payer: Self-pay | Admitting: Rehabilitative and Restorative Service Providers"

## 2022-07-08 DIAGNOSIS — M25551 Pain in right hip: Secondary | ICD-10-CM | POA: Insufficient documentation

## 2022-07-08 DIAGNOSIS — M6281 Muscle weakness (generalized): Secondary | ICD-10-CM | POA: Insufficient documentation

## 2022-07-08 NOTE — Therapy (Signed)
OUTPATIENT PHYSICAL THERAPY TREATMENT NOTE   Patient Name: Marisa Wilson MRN: [id number] DOB:1951/05/02, 71 y.o., female Today's Date: 07/03/2022   PT End of Session - 07/08/22 0737     Visit Number 2    Date for PT Re-Evaluation 08/28/22    Authorization Type [employer]    Progress Note Due on Visit 10    PT Start Time 0731    PT Stop Time 0801    PT Time Calculation (min) 30 min    Activity Tolerance Patient tolerated treatment well    Behavior During Therapy [facility] for tasks assessed/performed             History reviewed. No pertinent past medical history. History reviewed. No pertinent surgical history. There are no problems to display for this patient.   PCP: Marisa Small MD  REFERRING PROVIDER: Windy Carina FNP  REFERRING DIAG: M25.551 pain in right hip  THERAPY DIAG:  Right hip pain; weakness  Rationale for Evaluation and Treatment Rehabilitation  ONSET DATE: 05/02/22  SUBJECTIVE:   SUBJECTIVE STATEMENT: Pt reports that her pain this morning was a 7/10, but has decreased since moving around.   PERTINENT HISTORY: Recently joined a gym Had PT for hip bursitis in past 6-7 years ago: stretching and strength, ES, ice  PAIN:  Are you having pain? Yes NPRS scale: 3-7/10 Pain location: right posterior hip, a little to the back   Aggravating factors: sitting, AM Relieving factors: stretching, salve   PRECAUTIONS: None  WEIGHT BEARING RESTRICTIONS No  FALLS:  Has patient fallen in last 6 months? 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)   PLOF: Independent  PATIENT GOALS get hip feeling better;  hard to get in/out chairs, want to improve on this; getting back to the gym; [place] hikes    OBJECTIVE:   DIAGNOSTIC FINDINGS: x-ray negative   PATIENT SURVEYS:  Eval:  FOTO 62%  COGNITION:  Overall cognitive status: Within functional  limits for tasks assessed      MUSCLE LENGTH: Hamstrings: Right 70 deg; Left 70 deg Decreased right hip flexor length   POSTURE: decreased lumbar lordosis, increased thoracic kyphosis, and right scoliosis  PALPATION: Numerous tender points in right glute max and medius  LOWER EXTREMITY ROM:   Decreased right hip external rotation  LOWER EXTREMITY MMT:   Eval: Sit to stand:Unable to rise without mod UE assist   MMT Right eval Left eval  Hip flexion    Hip extension 4 4+  Hip abduction 4- 4  Hip adduction    Hip internal rotation    Hip external rotation    Knee flexion    Knee extension 4- 4-  Ankle dorsiflexion    Ankle plantarflexion    Ankle inversion    Ankle eversion     (Blank rows = not tested)   FUNCTIONAL TESTS:  Eval: 5 times sit to stand: with blue cushion in chair no hands  30 sec;  unable to rise from a standard chair height without UE use (knee adduction/valgus) Pelvic drop and trunk lean on right with SLS  GAIT:  Comments: decreased step length     TODAY'S TREATMENT: 07/08/2022: Nustep level 3 x5 min with PT present to discuss status Seated piriformis stretch 2x20 sec bilat Standing at step hamstring stretch 2x20 sec bilat Standing at step hip flexor stretch 2x20 sec bilat Standing at barre:  heel raises, hip  abduction.  BLE 2x10 each bilat Manual therapy:  Addaday to right hip, lumbar multifidi, hamstring, and IT band   Eval:  Initial HEP; treatment plan; basic DN info   PATIENT EDUCATION:  Education details: Initial HEP; treatment plan; basic DN info Person educated: Patient Education method: Explanation, Demonstration, and Handouts Education comprehension: verbalized understanding   HOME EXERCISE PROGRAM: Access Code: [email or web address] URL: [email or web address] Date: 07/03/2022 Prepared by: Ruben Im  Exercises - Seated Figure 4 Piriformis Stretch (Mirrored)  - 1 x daily - 7 x weekly - 1 sets - 3 reps - 30 hold -  Supine Piriformis Stretch with Leg Straight  - 1 x daily - 7 x weekly - 1 sets - 3 reps - 30 hold - Supine Butterfly Groin Stretch  - 1 x daily - 7 x weekly - 1 sets - 1 reps - 30 hold  ASSESSMENT:  CLINICAL IMPRESSION: [name] presents to skilled PT with reports of having increased pain in the morning, but states that as she starts moving around, she starts feeling a little better.  Pt able to progress with strengthening during session and reports a decrease in pain and soreness following use of addaday with manual therapy.  Pt will progress with HEP next visit to add strengthening.  Pt continues to require skilled PT to progress towards goal related activities.   OBJECTIVE IMPAIRMENTS decreased activity tolerance, difficulty walking, decreased ROM, decreased strength, hypomobility, increased fascial restrictions, and pain.   ACTIVITY LIMITATIONS carrying, lifting, sitting, standing, and locomotion level  PARTICIPATION LIMITATIONS: community activity, occupation, and recreational activities  [place] general health  will positively affect patient's functional outcome.   REHAB POTENTIAL: Good  CLINICAL DECISION MAKING: Stable/uncomplicated  EVALUATION COMPLEXITY: Low   GOALS: Goals reviewed with patient? Yes  SHORT TERM GOALS: Target date: 07/31/2022  The patient will demonstrate knowledge of basic self care strategies and exercises to promote healing   Baseline: Goal status: IN PROGRESS  2.  The patient will report a 40% improvement in pain levels with functional activities which are currently difficult including    Baseline:  Goal status: INITIAL  3.  The patient will have improved hip strength to at least 4/5 needed for standing, walking longer distances and descending stairs at home and in the community  Baseline:  Goal status: INITIAL  4.  The patient will have improved LE strength to rise from a standard chair without UE use 1x Baseline:  Goal status:  INITIAL    LONG TERM GOALS: Target date: 08/28/2022   The patient will be independent in a safe self progression of a home exercise program to promote further recovery of function   Baseline:  Goal status: INITIAL  2.  The patient will report a 75% improvement in pain levels with functional activities which are currently difficult   Baseline:  Goal status: INITIAL  3.  The patient will have improved hip strength to at least 4+/5 needed for standing, walking longer distances and descending stairs at home and in the community  Baseline:  Goal status: INITIAL  4.  5x sit to stand test improved to 20 sec or less with slightly higher chair (4 inch cushion) or 25 sec with min use of UEs in standard height chair Baseline:  Goal status: INITIAL  5.  The patient will have improved FOTO score to  76%     indicating improved function with less pain  Baseline:  Goal status: INITIAL     PLAN: PT  FREQUENCY: 2x/week  PT DURATION: 8 weeks  PLANNED INTERVENTIONS: Therapeutic exercises, Therapeutic activity, Neuromuscular re-education, Balance training, Gait training, Patient/Family education, Self Care, Joint mobilization, Aquatic Therapy, Dry Needling, Cryotherapy, Moist heat, Taping, Ultrasound, Ionotophoresis '4mg'$ /ml Dexamethasone, Manual therapy, and Re-evaluation  PLAN FOR NEXT SESSION:  Manual therapy and/or dry needling to right glutes/piriformis as needed;  right hip mobilizations with emphasis on increasing hip external rotation;  update and assess HEP as indicated; low level glute strengthening   Juel Burrow, PT 07/08/22 8:19 AM  [facility] [address], [place], [place] [address] Phone # [phone] Fax [phone]

## 2022-07-11 ENCOUNTER — Encounter: Payer: Self-pay | Admitting: Rehabilitative and Restorative Service Providers"

## 2022-07-11 ENCOUNTER — Ambulatory Visit: Payer: Medicare HMO | Admitting: Rehabilitative and Restorative Service Providers"

## 2022-07-11 DIAGNOSIS — M25551 Pain in right hip: Secondary | ICD-10-CM | POA: Diagnosis not present

## 2022-07-11 DIAGNOSIS — M6281 Muscle weakness (generalized): Secondary | ICD-10-CM

## 2022-07-11 NOTE — Therapy (Signed)
OUTPATIENT PHYSICAL THERAPY TREATMENT NOTE   Patient Name: Marisa Wilson MRN: [id number] DOB:May 29, 1951, 71 y.o.,, female Today's Date: 07/03/2022   PT End of Session - 07/11/22 0731     Visit Number 3    Date for PT Re-Evaluation 08/28/22    Authorization Type [employer]    Progress Note Due on Visit 10    PT Start Time 0729    PT Stop Time 0800    PT Time Calculation (min) 31 min    Activity Tolerance Patient tolerated treatment well    Behavior During Therapy [facility] for tasks assessed/performed             History reviewed. No pertinent past medical history. History reviewed. No pertinent surgical history. There are no problems to display for this patient.   PCP: Maurice Small MD  REFERRING PROVIDER: Windy Carina FNP  REFERRING DIAG: M25.551 pain in right hip  THERAPY DIAG:  Right hip pain; weakness  Rationale for Evaluation and Treatment Rehabilitation  ONSET DATE: 05/02/22  SUBJECTIVE:   SUBJECTIVE STATEMENT: Pt reports that her pain this morning was a 7/10, but has decreased since moving around.   PERTINENT HISTORY: Recently joined a gym Had PT for hip bursitis in past 6-7 years ago: stretching and strength, ES, ice  PAIN:  Are you having pain? Yes NPRS scale: 2/10 Pain location: right posterior hip, a little to the back   Aggravating factors: sitting, AM Relieving factors: stretching, salve   PRECAUTIONS: None  WEIGHT BEARING RESTRICTIONS No  FALLS:  Has patient fallen in last 6 months? 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)   PLOF: Independent  PATIENT GOALS get hip feeling better;  hard to get in/out chairs, want to improve on this; getting back to the gym; small hikes    OBJECTIVE:   DIAGNOSTIC FINDINGS: x-ray negative   PATIENT SURVEYS:  Eval:  FOTO 62%  COGNITION:  Overall cognitive status: Within functional  limits for tasks assessed      MUSCLE LENGTH: Hamstrings: Right 70 deg; Left 70 deg Decreased right hip flexor length   POSTURE: decreased lumbar lordosis, increased thoracic kyphosis, and right scoliosis  PALPATION: Numerous tender points in right glute max and medius  LOWER EXTREMITY ROM:   Decreased right hip external rotation  LOWER EXTREMITY MMT:   Eval: Sit to stand:Unable to rise without mod UE assist   MMT Right eval Left eval  Hip flexion    Hip extension 4 4+  Hip abduction 4- 4  Hip adduction    Hip internal rotation    Hip external rotation    Knee flexion    Knee extension 4- 4-  Ankle dorsiflexion    Ankle plantarflexion    Ankle inversion    Ankle eversion     (Blank rows = not tested)   FUNCTIONAL TESTS:  Eval: 5 times sit to stand: with blue cushion in chair no hands  30 sec;  unable to rise from a standard chair height without UE use (knee adduction/valgus) Pelvic drop and trunk lean on right with SLS  GAIT:  Comments: decreased step length     TODAY'S TREATMENT: 07/11/2022: Nustep level 3 x5 min with PT present to discuss status Seated piriformis stretch 2x20 sec bilat Hooklying posterior pelvic tilt (PPT) 2x10 Hooklying PPT with marching with blue loop 2x10 bilat Hooklying clamshells with blue loop 2x10 Bridging with hip  abduction using blue loop 2x10 Standing at barre:  hip flexion, hip abduction, hip extension with blue tband x10 bilat Standing at step hamstring stretch 2x20 sec bilat   07/08/2022: Nustep level 3 x5 min with PT present to discuss status Seated piriformis stretch 2x20 sec bilat Standing at step hamstring stretch 2x20 sec bilat Standing at step hip flexor stretch 2x20 sec bilat Standing at barre:  heel raises, hip abduction.  BLE 2x10 each bilat Manual therapy:  Addaday to right hip, lumbar multifidi, hamstring, and IT band   Eval:  Initial HEP; treatment plan; basic DN info   PATIENT EDUCATION:  Education  details: Issued HEP and provided red theraband Person educated: Patient Education method: Explanation, Demonstration, and Handouts Education comprehension: verbalized understanding   HOME EXERCISE PROGRAM: Access Code: [email or web address] URL: [email or web address] Date: 07/11/2022 Prepared by: Shelby Dubin Ryley Bachtel  Exercises - Seated Figure 4 Piriformis Stretch (Mirrored)  - 1 x daily - 7 x weekly - 1 sets - 3 reps - 30 hold - Supine Piriformis Stretch with Leg Straight  - 1 x daily - 7 x weekly - 1 sets - 3 reps - 30 hold - Supine Butterfly Groin Stretch  - 1 x daily - 7 x weekly - 1 sets - 1 reps - 30 hold - Supine Bridge with Resistance Band  - 1 x daily - 7 x weekly - 2 sets - 10 reps - Hooklying Clamshell with Resistance  - 1 x daily - 7 x weekly - 2 sets - 10 reps - Standing Hip Flexion with Resistance Loop  - 1 x daily - 7 x weekly - 2 sets - 10 reps - Hip Abduction with Resistance Loop  - 1 x daily - 7 x weekly - 2 sets - 10 reps - Hip Extension with Resistance Loop  - 1 x daily - 7 x weekly - 2 sets - 10 reps - Standing Hamstring Stretch with Step  - 1 x daily - 7 x weekly - 1 sets - 2 reps - 20 sec hold  ASSESSMENT:  CLINICAL IMPRESSION: [name] presents to skilled PT with reports of continued worse pain upon waking up, but states that the pain decreases as she starts moving around.  Pt provided with red theraband for HEP at home and updated HEP.  Pt without reports of increased pain during session, but did report some muscle fatigue/soreness.  Ended session with hamstring stretch to assist with soreness.  Pt continues to require skilled PT to progress towards goal related activities.   OBJECTIVE IMPAIRMENTS decreased activity tolerance, difficulty walking, decreased ROM, decreased strength, hypomobility, increased fascial restrictions, and pain.   ACTIVITY LIMITATIONS carrying, lifting, sitting, standing, and locomotion level  PARTICIPATION LIMITATIONS: community activity,  occupation, and recreational activities  [place] general health  will positively affect patient's functional outcome.   REHAB POTENTIAL: Good  CLINICAL DECISION MAKING: Stable/uncomplicated  EVALUATION COMPLEXITY: Low   GOALS: Goals reviewed with patient? Yes  SHORT TERM GOALS: Target date: 07/31/2022  The patient will demonstrate knowledge of basic self care strategies and exercises to promote healing   Baseline: Goal status: IN PROGRESS  2.  The patient will report a 40% improvement in pain levels with functional activities which are currently difficult including    Baseline:  Goal status: INITIAL  3.  The patient will have improved hip strength to at least 4/5 needed for standing, walking longer distances and descending stairs at home and in the community  Baseline:  Goal  status: INITIAL  4.  The patient will have improved LE strength to rise from a standard chair without UE use 1x Baseline:  Goal status: INITIAL    LONG TERM GOALS: Target date: 08/28/2022   The patient will be independent in a safe self progression of a home exercise program to promote further recovery of function   Baseline:  Goal status: INITIAL  2.  The patient will report a 75% improvement in pain levels with functional activities which are currently difficult   Baseline:  Goal status: INITIAL  3.  The patient will have improved hip strength to at least 4+/5 needed for standing, walking longer distances and descending stairs at home and in the community  Baseline:  Goal status: INITIAL  4.  5x sit to stand test improved to 20 sec or less with slightly higher chair (4 inch cushion) or 25 sec with min use of UEs in standard height chair Baseline:  Goal status: INITIAL  5.  The patient will have improved FOTO score to  76%     indicating improved function with less pain  Baseline:  Goal status: INITIAL     PLAN: PT FREQUENCY: 2x/week  PT DURATION: 8 weeks  PLANNED  INTERVENTIONS: Therapeutic exercises, Therapeutic activity, Neuromuscular re-education, Balance training, Gait training, Patient/Family education, Self Care, Joint mobilization, Aquatic Therapy, Dry Needling, Cryotherapy, Moist heat, Taping, Ultrasound, Ionotophoresis '4mg'$ /ml Dexamethasone, Manual therapy, and Re-evaluation  PLAN FOR NEXT SESSION:  Manual therapy and/or dry needling to right glutes/piriformis as needed;  right hip mobilizations with emphasis on increasing hip external rotation;  update and assess HEP as indicated; low level glute strengthening   Juel Burrow, PT 07/11/22 8:22 AM  [facility] [address] [place], [place] [address] Phone # [phone] Fax [phone]

## 2022-07-15 ENCOUNTER — Encounter: Payer: Self-pay | Admitting: Rehabilitative and Restorative Service Providers"

## 2022-07-15 ENCOUNTER — Ambulatory Visit: Payer: Medicare HMO | Admitting: Rehabilitative and Restorative Service Providers"

## 2022-07-15 DIAGNOSIS — M6281 Muscle weakness (generalized): Secondary | ICD-10-CM

## 2022-07-15 DIAGNOSIS — M25551 Pain in right hip: Secondary | ICD-10-CM

## 2022-07-15 NOTE — Therapy (Signed)
OUTPATIENT PHYSICAL THERAPY TREATMENT NOTE   Patient Name: Marisa Wilson MRN: [id number] DOB:1951-04-01, 71 y.o., female Today's Date: 07/03/2022   PT End of Session - 07/15/22 0743     Visit Number 4    Date for PT Re-Evaluation 08/28/22    Authorization Type [employer]    Progress Note Due on Visit 10    PT Start Time [phone]    PT Stop Time 0800    PT Time Calculation (min) 27 min    Activity Tolerance Patient tolerated treatment well    Behavior During Therapy [facility] for tasks assessed/performed             History reviewed. No pertinent past medical history. History reviewed. No pertinent surgical history. There are no problems to display for this patient.   PCP: Marisa Small MD  REFERRING PROVIDER: Windy Carina FNP  REFERRING DIAG: M25.551 pain in right hip  THERAPY DIAG:  Right hip pain; weakness  Rationale for Evaluation and Treatment Rehabilitation  ONSET DATE: 05/02/22  SUBJECTIVE:   SUBJECTIVE STATEMENT: Pt reports that her pain this morning was a 7/10, but has decreased since moving around.   PERTINENT HISTORY: Recently joined a gym Had PT for hip bursitis in past 6-7 years ago: stretching and strength, ES, ice  PAIN:  Are you having pain? Yes NPRS scale: 2/10 Pain location: right posterior hip, a little to the back   Aggravating factors: sitting, AM Relieving factors: stretching, salve   PRECAUTIONS: None  WEIGHT BEARING RESTRICTIONS No  FALLS:  Has patient fallen in last 6 months? 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)   PLOF: Independent  PATIENT GOALS get hip feeling better;  hard to get in/out chairs, want to improve on this; getting back to the gym; [place] hikes    OBJECTIVE:   DIAGNOSTIC FINDINGS: x-ray negative   PATIENT SURVEYS:  Eval:  FOTO 62%  COGNITION:  Overall cognitive status: Within functional  limits for tasks assessed      MUSCLE LENGTH: Hamstrings: Right 70 deg; Left 70 deg Decreased right hip flexor length   POSTURE: decreased lumbar lordosis, increased thoracic kyphosis, and right scoliosis  PALPATION: Numerous tender points in right glute max and medius  LOWER EXTREMITY ROM:   Decreased right hip external rotation  LOWER EXTREMITY MMT:   Eval: Sit to stand:Unable to rise without mod UE assist   MMT Right eval Left eval  Hip flexion    Hip extension 4 4+  Hip abduction 4- 4  Hip adduction    Hip internal rotation    Hip external rotation    Knee flexion    Knee extension 4- 4-  Ankle dorsiflexion    Ankle plantarflexion    Ankle inversion    Ankle eversion     (Blank rows = not tested)   FUNCTIONAL TESTS:  Eval: 5 times sit to stand: with blue cushion in chair no hands  30 sec;  unable to rise from a standard chair height without UE use (knee adduction/valgus) Pelvic drop and trunk lean on right with SLS  GAIT:  Comments: decreased step length     TODAY'S TREATMENT: 07/15/2022: Nustep level 3 x5 min with PT present to discuss status Seated hamstring stretch 2x20 sec bilat Seated piriformis stretch 2x20 sec bilat 4 way pelvic tilt seated on dynadisc x20 each Standing at barre:  hip flexion, hip abduction, hip  extension with blue tband 2x10 bilat Wall slides 2x10   07/11/2022: Nustep level 3 x5 min with PT present to discuss status Seated piriformis stretch 2x20 sec bilat Hooklying posterior pelvic tilt (PPT) 2x10 Hooklying PPT with marching with blue loop 2x10 bilat Hooklying clamshells with blue loop 2x10 Bridging with hip abduction using blue loop 2x10 Standing at barre:  hip flexion, hip abduction, hip extension with blue tband x10 bilat Standing at step hamstring stretch 2x20 sec bilat   07/08/2022: Nustep level 3 x5 min with PT present to discuss status Seated piriformis stretch 2x20 sec bilat Standing at step hamstring stretch  2x20 sec bilat Standing at step hip flexor stretch 2x20 sec bilat Standing at barre:  heel raises, hip abduction.  BLE 2x10 each bilat Manual therapy:  Addaday to right hip, lumbar multifidi, hamstring, and IT band    PATIENT EDUCATION:  Education details: Issued HEP and provided red theraband Person educated: Patient Education method: Explanation, Demonstration, and Handouts Education comprehension: verbalized understanding   HOME EXERCISE PROGRAM: Access Code: [email or web address] URL: [email or web address] Date: 07/11/2022 Prepared by: Shelby Dubin Yittel Emrich  Exercises - Seated Figure 4 Piriformis Stretch (Mirrored)  - 1 x daily - 7 x weekly - 1 sets - 3 reps - 30 hold - Supine Piriformis Stretch with Leg Straight  - 1 x daily - 7 x weekly - 1 sets - 3 reps - 30 hold - Supine Butterfly Groin Stretch  - 1 x daily - 7 x weekly - 1 sets - 1 reps - 30 hold - Supine Bridge with Resistance Band  - 1 x daily - 7 x weekly - 2 sets - 10 reps - Hooklying Clamshell with Resistance  - 1 x daily - 7 x weekly - 2 sets - 10 reps - Standing Hip Flexion with Resistance Loop  - 1 x daily - 7 x weekly - 2 sets - 10 reps - Hip Abduction with Resistance Loop  - 1 x daily - 7 x weekly - 2 sets - 10 reps - Hip Extension with Resistance Loop  - 1 x daily - 7 x weekly - 2 sets - 10 reps - Standing Hamstring Stretch with Step  - 1 x daily - 7 x weekly - 1 sets - 2 reps - 20 sec hold  ASSESSMENT:  CLINICAL IMPRESSION: [name] presents to skilled PT with reports of feeling overall at least 60% better since initial evaluation.  Pt  continues to progress with strengthening throughout session.  Pt requires less cuing for technique throughout session and is progressing overall with decreased pain.  Pt continues to requires skilled PT to progress towards goal related activities.   OBJECTIVE IMPAIRMENTS decreased activity tolerance, difficulty walking, decreased ROM, decreased strength, hypomobility, increased  fascial restrictions, and pain.   ACTIVITY LIMITATIONS carrying, lifting, sitting, standing, and locomotion level  PARTICIPATION LIMITATIONS: community activity, occupation, and recreational activities  [place] general health  will positively affect patient's functional outcome.   REHAB POTENTIAL: Good  CLINICAL DECISION MAKING: Stable/uncomplicated  EVALUATION COMPLEXITY: Low   GOALS: Goals reviewed with patient? Yes  SHORT TERM GOALS: Target date: 07/31/2022  The patient will demonstrate knowledge of basic self care strategies and exercises to promote healing   Baseline: Goal status: MET on 07/15/2022  2.  The patient will report a 40% improvement in pain levels with functional activities which are currently difficult including    Baseline:  Goal status: MET on 07/15/2022  3.  The patient will have  improved hip strength to at least 4/5 needed for standing, walking longer distances and descending stairs at home and in the community  Baseline:  Goal status: INITIAL  4.  The patient will have improved LE strength to rise from a standard chair without UE use 1x Baseline:  Goal status: INITIAL    LONG TERM GOALS: Target date: 08/28/2022   The patient will be independent in a safe self progression of a home exercise program to promote further recovery of function   Baseline:  Goal status: INITIAL  2.  The patient will report a 75% improvement in pain levels with functional activities which are currently difficult   Baseline:  Goal status: INITIAL  3.  The patient will have improved hip strength to at least 4+/5 needed for standing, walking longer distances and descending stairs at home and in the community  Baseline:  Goal status: INITIAL  4.  5x sit to stand test improved to 20 sec or less with slightly higher chair (4 inch cushion) or 25 sec with min use of UEs in standard height chair Baseline:  Goal status: INITIAL  5.  The patient will have  improved FOTO score to  76%     indicating improved function with less pain  Baseline:  Goal status: INITIAL     PLAN: PT FREQUENCY: 2x/week  PT DURATION: 8 weeks  PLANNED INTERVENTIONS: Therapeutic exercises, Therapeutic activity, Neuromuscular re-education, Balance training, Gait training, Patient/Family education, Self Care, Joint mobilization, Aquatic Therapy, Dry Needling, Cryotherapy, Moist heat, Taping, Ultrasound, Ionotophoresis 17m/ml Dexamethasone, Manual therapy, and Re-evaluation  PLAN FOR NEXT SESSION:  Manual therapy and/or dry needling to right glutes/piriformis as needed;  right hip mobilizations with emphasis on increasing hip external rotation;  update and assess HEP as indicated; low level glute strengthening   SJuel Burrow PT 07/15/22 8:33 AM  [facility] [place] [address]Phone # [phone] [phone]

## 2022-07-18 ENCOUNTER — Ambulatory Visit: Payer: Medicare HMO | Admitting: Rehabilitative and Restorative Service Providers"

## 2022-07-18 ENCOUNTER — Encounter: Payer: Self-pay | Admitting: Rehabilitative and Restorative Service Providers"

## 2022-07-18 DIAGNOSIS — M25551 Pain in right hip: Secondary | ICD-10-CM

## 2022-07-18 DIAGNOSIS — M6281 Muscle weakness (generalized): Secondary | ICD-10-CM | POA: Diagnosis not present

## 2022-07-18 NOTE — Therapy (Signed)
OUTPATIENT PHYSICAL THERAPY TREATMENT NOTE   Patient Name: Marisa Wilson MRN: [id number] DOB:11/13/1950, 71 y.o., female Today's Date: 07/03/2022   PT End of Session - 07/18/22 0744     Visit Number 5    Date for PT Re-Evaluation 08/28/22    Authorization Type [employer]    Progress Note Due on Visit 10    PT Start Time [phone]    PT Stop Time 0800    PT Time Calculation (min) 27 min    Activity Tolerance Patient tolerated treatment well    Behavior During Therapy [facility] for tasks assessed/performed             History reviewed. No pertinent past medical history. History reviewed. No pertinent surgical history. There are no problems to display for this patient.   PCP: Maurice Small MD  REFERRING PROVIDER: Windy Carina FNP  REFERRING DIAG: M25.551 pain in right hip  THERAPY DIAG:  Right hip pain; weakness  Rationale for Evaluation and Treatment Rehabilitation  ONSET DATE: 05/02/22  SUBJECTIVE:   SUBJECTIVE STATEMENT: Pt reports that she is not having as much pain in the morning as she has been having.  Reports some muscle soreness following last session, but the soreness did not last long.   PERTINENT HISTORY: Recently joined a gym Had PT for hip bursitis in past 6-7 years ago: stretching and strength, ES, ice  PAIN:  Are you having pain? Yes NPRS scale: 3/10 Pain location: right posterior hip, a little to the back   Aggravating factors: sitting, AM Relieving factors: stretching, salve   PRECAUTIONS: None  WEIGHT BEARING RESTRICTIONS No  FALLS:  Has patient fallen in last 6 months? 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)   PLOF: Independent  PATIENT GOALS get hip feeling better;  hard to get in/out chairs, want to improve on this; getting back to the gym; small hikes    OBJECTIVE:   DIAGNOSTIC FINDINGS: x-ray negative   PATIENT  SURVEYS:  Eval:  FOTO 62%  COGNITION:  Overall cognitive status: Within functional limits for tasks assessed      MUSCLE LENGTH: Hamstrings: Right 70 deg; Left 70 deg Decreased right hip flexor length   POSTURE: decreased lumbar lordosis, increased thoracic kyphosis, and right scoliosis  PALPATION: Numerous tender points in right glute max and medius  LOWER EXTREMITY ROM:   Decreased right hip external rotation  LOWER EXTREMITY MMT:   Eval: Sit to stand:Unable to rise without mod UE assist   MMT Right eval Left eval  Hip flexion    Hip extension 4 4+  Hip abduction 4- 4  Hip adduction    Hip internal rotation    Hip external rotation    Knee flexion    Knee extension 4- 4-  Ankle dorsiflexion    Ankle plantarflexion    Ankle inversion    Ankle eversion     (Blank rows = not tested)   FUNCTIONAL TESTS:  Eval: 5 times sit to stand: with blue cushion in chair no hands  30 sec;  unable to rise from a standard chair height without UE use (knee adduction/valgus) Pelvic drop and trunk lean on right with SLS  GAIT:  Comments: decreased step length     TODAY'S TREATMENT: 07/18/2022: Nustep level 5 x6 min with PT present to discuss status Seated hamstring stretch 2x20 sec bilat Seated piriformis stretch 2x20 sec bilat 4  way pelvic tilt seated on dynadisc x20 each Standing at barre:  hip flexion, hip abduction, hip extension with blue tband 2x10 bilat Wall slides 2x10 Standing at step hip flexor stretch 2x20 sec bilat   07/15/2022: Nustep level 3 x5 min with PT present to discuss status Seated hamstring stretch 2x20 sec bilat Seated piriformis stretch 2x20 sec bilat 4 way pelvic tilt seated on dynadisc x20 each Standing at barre:  hip flexion, hip abduction, hip extension with blue tband 2x10 bilat Wall slides 2x10   07/11/2022: Nustep level 3 x5 min with PT present to discuss status Seated piriformis stretch 2x20 sec bilat Hooklying posterior pelvic tilt  (PPT) 2x10 Hooklying PPT with marching with blue loop 2x10 bilat Hooklying clamshells with blue loop 2x10 Bridging with hip abduction using blue loop 2x10 Standing at barre:  hip flexion, hip abduction, hip extension with blue tband x10 bilat Standing at step hamstring stretch 2x20 sec bilat     PATIENT EDUCATION:  Education details: Issued HEP and provided red theraband Person educated: Patient Education method: Explanation, Demonstration, and Handouts Education comprehension: verbalized understanding   HOME EXERCISE PROGRAM: Access Code: [email or web address] URL: [email or web address] Date: 07/11/2022 Prepared by: Shelby Dubin Marily Konczal  Exercises - Seated Figure 4 Piriformis Stretch (Mirrored)  - 1 x daily - 7 x weekly - 1 sets - 3 reps - 30 hold - Supine Piriformis Stretch with Leg Straight  - 1 x daily - 7 x weekly - 1 sets - 3 reps - 30 hold - Supine Butterfly Groin Stretch  - 1 x daily - 7 x weekly - 1 sets - 1 reps - 30 hold - Supine Bridge with Resistance Band  - 1 x daily - 7 x weekly - 2 sets - 10 reps - Hooklying Clamshell with Resistance  - 1 x daily - 7 x weekly - 2 sets - 10 reps - Standing Hip Flexion with Resistance Loop  - 1 x daily - 7 x weekly - 2 sets - 10 reps - Hip Abduction with Resistance Loop  - 1 x daily - 7 x weekly - 2 sets - 10 reps - Hip Extension with Resistance Loop  - 1 x daily - 7 x weekly - 2 sets - 10 reps - Standing Hamstring Stretch with Step  - 1 x daily - 7 x weekly - 1 sets - 2 reps - 20 sec hold  ASSESSMENT:  CLINICAL IMPRESSION: Marisa Wilson continues to progress towards goal related activities.  Pt is progressing with strengthening and decreased pain during session.  Pt has been able to start returning to the gym for exercises and reports less pain.  Pt continues to require skilled PT to progress towards goal related activities.   OBJECTIVE IMPAIRMENTS decreased activity tolerance, difficulty walking, decreased ROM, decreased strength,  hypomobility, increased fascial restrictions, and pain.   ACTIVITY LIMITATIONS carrying, lifting, sitting, standing, and locomotion level  PARTICIPATION LIMITATIONS: community activity, occupation, and recreational activities  [place] general health  will positively affect patient's functional outcome.   REHAB POTENTIAL: Good  CLINICAL DECISION MAKING: Stable/uncomplicated  EVALUATION COMPLEXITY: Low   GOALS: Goals reviewed with patient? Yes  SHORT TERM GOALS: Target date: 07/31/2022  The patient will demonstrate knowledge of basic self care strategies and exercises to promote healing   Baseline: Goal status: MET on 07/15/2022  2.  The patient will report a 40% improvement in pain levels with functional activities which are currently difficult including    Baseline:  Goal status:  MET on 07/15/2022  3.  The patient will have improved hip strength to at least 4/5 needed for standing, walking longer distances and descending stairs at home and in the community  Baseline:  Goal status: IN PROGRESS  4.  The patient will have improved LE strength to rise from a standard chair without UE use 1x Baseline:  Goal status: IN PROGRESS    LONG TERM GOALS: Target date: 08/28/2022   The patient will be independent in a safe self progression of a home exercise program to promote further recovery of function   Baseline:  Goal status: IN PROGRESS  2.  The patient will report a 75% improvement in pain levels with functional activities which are currently difficult   Baseline:  Goal status: IN PROGRESS  3.  The patient will have improved hip strength to at least 4+/5 needed for standing, walking longer distances and descending stairs at home and in the community  Baseline:  Goal status: INITIAL  4.  5x sit to stand test improved to 20 sec or less with slightly higher chair (4 inch cushion) or 25 sec with min use of UEs in standard height chair Baseline:  Goal status:  INITIAL  5.  The patient will have improved FOTO score to  76%     indicating improved function with less pain  Baseline:  Goal status: INITIAL     PLAN: PT FREQUENCY: 2x/week  PT DURATION: 8 weeks  PLANNED INTERVENTIONS: Therapeutic exercises, Therapeutic activity, Neuromuscular re-education, Balance training, Gait training, Patient/Family education, Self Care, Joint mobilization, Aquatic Therapy, Dry Needling, Cryotherapy, Moist heat, Taping, Ultrasound, Ionotophoresis 46m/ml Dexamethasone, Manual therapy, and Re-evaluation  PLAN FOR NEXT SESSION:  Manual therapy and/or dry needling to right glutes/piriformis as needed;  right hip mobilizations with emphasis on increasing hip external rotation;  update and assess HEP as indicated; low level glute strengthening   SJuel Burrow PT 07/18/22 7:45 AM  [facility] [address] ST[place]pey Village[place] [address]Phone # [phone] [phone]

## 2022-07-19 DIAGNOSIS — Z23 Encounter for immunization: Secondary | ICD-10-CM | POA: Diagnosis not present

## 2022-07-26 ENCOUNTER — Encounter: Payer: Self-pay | Admitting: Rehabilitative and Restorative Service Providers"

## 2022-07-26 ENCOUNTER — Ambulatory Visit: Payer: Medicare HMO | Admitting: Rehabilitative and Restorative Service Providers"

## 2022-07-26 DIAGNOSIS — M6281 Muscle weakness (generalized): Secondary | ICD-10-CM | POA: Diagnosis not present

## 2022-07-26 DIAGNOSIS — M25551 Pain in right hip: Secondary | ICD-10-CM

## 2022-07-26 NOTE — Therapy (Signed)
OUTPATIENT PHYSICAL THERAPY TREATMENT NOTE   Patient Name: Marisa Wilson MRN: [id number] DOB:[birth year], 71 y.o., female Today's Date: 07/03/2022   PT End of Session - 07/26/22 1021     Visit Number 6    Date for PT Re-Evaluation 08/28/22    Authorization Type [employer] Medicare    Progress Note Due on Visit 10    PT Start Time 1018    PT Stop Time 1056    PT Time Calculation (min) 38 min    Activity Tolerance Patient tolerated treatment well    Behavior During Therapy [facility] for tasks assessed/performed             History reviewed. No pertinent past medical history. History reviewed. No pertinent surgical history. There are no problems to display for this patient.   PCP: Marisa Small MD  REFERRING PROVIDER: Windy Carina FNP  REFERRING DIAG: M25.551 pain in right hip  THERAPY DIAG:  Right hip pain; weakness  Rationale for Evaluation and Treatment Rehabilitation  ONSET DATE: 05/02/22  SUBJECTIVE:   SUBJECTIVE STATEMENT: Pt reports that she worked this morning and started having some pain in her right hip, interested in trying dry needling today.   PERTINENT HISTORY: Recently joined a gym Had PT for hip bursitis in past 6-7 years ago: stretching and strength, ES, ice  PAIN:  Are you having pain? Yes NPRS scale: 3-4/10 Pain location: right posterior hip, a little to the back   Aggravating factors: sitting, AM Relieving factors: stretching, salve   PRECAUTIONS: None  WEIGHT BEARING RESTRICTIONS No  FALLS:  Has patient fallen in last 6 months? 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)   PLOF: Independent  PATIENT GOALS get hip feeling better;  hard to get in/out chairs, want to improve on this; getting back to the gym; [place] hikes    OBJECTIVE:   DIAGNOSTIC FINDINGS: x-ray negative   PATIENT SURVEYS:  Eval:  FOTO  62%  COGNITION:  Overall cognitive status: Within functional limits for tasks assessed      MUSCLE LENGTH: Hamstrings: Right 70 deg; Left 70 deg Decreased right hip flexor length   POSTURE: decreased lumbar lordosis, increased thoracic kyphosis, and right scoliosis  PALPATION: Numerous tender points in right glute max and medius  LOWER EXTREMITY ROM:   Decreased right hip external rotation  LOWER EXTREMITY MMT:   Eval: Sit to stand:Unable to rise without mod UE assist   MMT Right eval Left eval  Hip flexion    Hip extension 4 4+  Hip abduction 4- 4  Hip adduction    Hip internal rotation    Hip external rotation    Knee flexion    Knee extension 4- 4-  Ankle dorsiflexion    Ankle plantarflexion    Ankle inversion    Ankle eversion     (Blank rows = not tested)   FUNCTIONAL TESTS:  Eval: 5 times sit to stand: with blue cushion in chair no hands  30 sec;  unable to rise from a standard chair height without UE use (knee adduction/valgus) Pelvic drop and trunk lean on right with SLS  GAIT:  Comments: decreased step length     TODAY'S TREATMENT: 07/26/2022: Nustep level 5 x6 min with PT present to discuss status 4 way pelvic tilt seated on dynadisc x20 each Trigger Point Dry-Needling  Treatment instructions: Expect mild to moderate muscle soreness. S/S of pneumothorax  if dry needled over a lung field, and to seek immediate medical attention should they occur. Patient verbalized understanding of these instructions and education. Patient Consent Given: Yes Education handout provided: Previously provided Muscles treated: right glute, right piriformis, right TFL Electrical stimulation performed: No Parameters: N/A Treatment response/outcome: Utilized skilled palpation to identify trigger points.  Able to palpate twitch response and muscle elongation following. Manual Therapy:  Addaday to right glutes, IT band/TFL, right hamstring Seated hamstring stretch 2x20  sec bilat Seated piriformis stretch 2x20 sec bilat Hip Martrix 40# for hip abduction and hip extension x10 bilat Leg press 90# 2x10 Rocker board x2 min FWD step ups 6" without UE use x10 bilat   07/18/2022: Nustep level 5 x6 min with PT present to discuss status Seated hamstring stretch 2x20 sec bilat Seated piriformis stretch 2x20 sec bilat 4 way pelvic tilt seated on dynadisc x20 each Standing at barre:  hip flexion, hip abduction, hip extension with blue tband 2x10 bilat Wall slides 2x10 Standing at step hip flexor stretch 2x20 sec bilat   07/15/2022: Nustep level 3 x5 min with PT present to discuss status Seated hamstring stretch 2x20 sec bilat Seated piriformis stretch 2x20 sec bilat 4 way pelvic tilt seated on dynadisc x20 each Standing at barre:  hip flexion, hip abduction, hip extension with blue tband 2x10 bilat Wall slides 2x10     PATIENT EDUCATION:  Education details: Issued HEP and provided red theraband Person educated: Patient Education method: Explanation, Demonstration, and Handouts Education comprehension: verbalized understanding   HOME EXERCISE PROGRAM: Access Code: [email or web address] URL: [email or web address] Date: 07/11/2022 Prepared by: Shelby Dubin Terald Jump  Exercises - Seated Figure 4 Piriformis Stretch (Mirrored)  - 1 x daily - 7 x weekly - 1 sets - 3 reps - 30 hold - Supine Piriformis Stretch with Leg Straight  - 1 x daily - 7 x weekly - 1 sets - 3 reps - 30 hold - Supine Butterfly Groin Stretch  - 1 x daily - 7 x weekly - 1 sets - 1 reps - 30 hold - Supine Bridge with Resistance Band  - 1 x daily - 7 x weekly - 2 sets - 10 reps - Hooklying Clamshell with Resistance  - 1 x daily - 7 x weekly - 2 sets - 10 reps - Standing Hip Flexion with Resistance Loop  - 1 x daily - 7 x weekly - 2 sets - 10 reps - Hip Abduction with Resistance Loop  - 1 x daily - 7 x weekly - 2 sets - 10 reps - Hip Extension with Resistance Loop  - 1 x daily - 7 x weekly - 2  sets - 10 reps - Standing Hamstring Stretch with Step  - 1 x daily - 7 x weekly - 1 sets - 2 reps - 20 sec hold  ASSESSMENT:  CLINICAL IMPRESSION: [name] reports that she is having some increased pain today and is interested in pursuing dry needling therapy.  Pt with good twitch response noted during dry needling.  Pt with muscle elongation noted following and proceeded with Addaday manual therapy.  Pt reports 0/10 pain following dry needling, then manual therapy.  Pt able to progress through rest of session without pain and continues to report 0/10 pain at completion of PT session.   OBJECTIVE IMPAIRMENTS decreased activity tolerance, difficulty walking, decreased ROM, decreased strength, hypomobility, increased fascial restrictions, and pain.   ACTIVITY LIMITATIONS carrying, lifting, sitting, standing, and locomotion level  PARTICIPATION LIMITATIONS: community activity, occupation, and  recreational activities  [place] general health  will positively affect patient's functional outcome.   REHAB POTENTIAL: Good  CLINICAL DECISION MAKING: Stable/uncomplicated  EVALUATION COMPLEXITY: Low   GOALS: Goals reviewed with patient? Yes  SHORT TERM GOALS: Target date: 07/31/2022  The patient will demonstrate knowledge of basic self care strategies and exercises to promote healing   Baseline: Goal status: MET on 07/15/2022  2.  The patient will report a 40% improvement in pain levels with functional activities which are currently difficult including    Baseline:  Goal status: MET on 07/15/2022  3.  The patient will have improved hip strength to at least 4/5 needed for standing, walking longer distances and descending stairs at home and in the community  Baseline:  Goal status: IN PROGRESS  4.  The patient will have improved LE strength to rise from a standard chair without UE use 1x Baseline:  Goal status: MET on 07/26/2022    LONG TERM GOALS: Target date:  08/28/2022   The patient will be independent in a safe self progression of a home exercise program to promote further recovery of function   Baseline:  Goal status: IN PROGRESS  2.  The patient will report a 75% improvement in pain levels with functional activities which are currently difficult   Baseline:  Goal status: IN PROGRESS  3.  The patient will have improved hip strength to at least 4+/5 needed for standing, walking longer distances and descending stairs at home and in the community  Baseline:  Goal status: INITIAL  4.  5x sit to stand test improved to 20 sec or less with slightly higher chair (4 inch cushion) or 25 sec with min use of UEs in standard height chair Baseline:  Goal status: INITIAL  5.  The patient will have improved FOTO score to  76%     indicating improved function with less pain  Baseline:  Goal status: INITIAL     PLAN: PT FREQUENCY: 2x/week  PT DURATION: 8 weeks  PLANNED INTERVENTIONS: Therapeutic exercises, Therapeutic activity, Neuromuscular re-education, Balance training, Gait training, Patient/Family education, Self Care, Joint mobilization, Aquatic Therapy, Dry Needling, Cryotherapy, Moist heat, Taping, Ultrasound, Ionotophoresis 13m/ml Dexamethasone, Manual therapy, and Re-evaluation  PLAN FOR NEXT SESSION:  Manual therapy and/or dry needling to right glutes/piriformis as needed;  right hip mobilizations with emphasis on increasing hip external rotation;  update and assess HEP as indicated; low level glute strengthening   SJuel Burrow PT 07/26/22 11:00 AM  [place] SCentrahomaGGate [place] [address]Phone # [phone] [phone]

## 2022-08-02 ENCOUNTER — Encounter: Payer: Self-pay | Admitting: Rehabilitative and Restorative Service Providers"

## 2022-08-02 ENCOUNTER — Ambulatory Visit: Payer: Medicare HMO | Admitting: Rehabilitative and Restorative Service Providers"

## 2022-08-02 DIAGNOSIS — M6281 Muscle weakness (generalized): Secondary | ICD-10-CM | POA: Diagnosis not present

## 2022-08-02 DIAGNOSIS — M25551 Pain in right hip: Secondary | ICD-10-CM | POA: Diagnosis not present

## 2022-08-02 NOTE — Therapy (Addendum)
OUTPATIENT PHYSICAL THERAPY TREATMENT NOTE AND LATE ENTRY DISCHARGE SUMMARY   Patient Name: Marisa Wilson MRN: [id number] DOB:Aug 31, 1951, 71 y.o., female Today's Date: 07/03/2022   PT End of Session - 08/02/22 1018     Visit Number 7    Date for PT Re-Evaluation 08/28/22    Authorization Type A[employer]na Medicare    Progress Note Due on Visit 10    PT Start Time 1013    PT Stop Time 1053    PT Time Calculation (min) 40 min    Activity Tolerance Patient tolerated treatment well    Behavior During Therapy [facility] for tasks assessed/performed             History reviewed. No pertinent past medical history. History reviewed. No pertinent surgical history. There are no problems to display for this patient.   PCP: Maurice Small MD  REFERRING PROVIDER: Windy Carina FNP  REFERRING DIAG: M25.551 pain in right hip  THERAPY DIAG:  Right hip pain; weakness  Rationale for Evaluation and Treatment Rehabilitation  ONSET DATE: 05/02/22  SUBJECTIVE:   SUBJECTIVE STATEMENT: Pt reports that dry needling seemed to help and she is not currently having pain, but did have a catch in her hip this morning.   PERTINENT HISTORY: Recently joined a gym Had PT for hip bursitis in past 6-7 years ago: stretching and strength, ES, ice  PAIN:  Are you having pain? No NPRS scale: 0/10 Pain location: right posterior hip, a little to the back   Aggravating factors: sitting, AM Relieving factors: stretching, salve   PRECAUTIONS: None  WEIGHT BEARING RESTRICTIONS No  FALLS:  Has patient fallen in last 6 months? 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)   PLOF: Independent  PATIENT GOALS get hip feeling better;  hard to get in/out chairs, want to improve on this; getting back to the gym; small hikes    OBJECTIVE:   DIAGNOSTIC FINDINGS: x-ray negative   PATIENT SURVEYS:  Eval:   FOTO 62%  COGNITION:  Overall cognitive status: Within functional limits for tasks assessed      MUSCLE LENGTH: Hamstrings: Right 70 deg; Left 70 deg Decreased right hip flexor length   POSTURE: decreased lumbar lordosis, increased thoracic kyphosis, and right scoliosis  PALPATION: Numerous tender points in right glute max and medius  LOWER EXTREMITY ROM:   Decreased right hip external rotation  LOWER EXTREMITY MMT:   Eval: Sit to stand:Unable to rise without mod UE assist   MMT Right eval Left eval Bilat  08/02/2022  Hip flexion     Hip extension 4 4+ 4+  Hip abduction 4- 4 4  Hip adduction     Hip internal rotation     Hip external rotation     Knee flexion     Knee extension 4- 4- 4  Ankle dorsiflexion     Ankle plantarflexion     Ankle inversion     Ankle eversion      (Blank rows = not tested)   FUNCTIONAL TESTS:  Eval: 5 times sit to stand: with blue cushion in chair no hands  30 sec;  unable to rise from a standard chair height without UE use (knee adduction/valgus) Pelvic drop and trunk lean on right with SLS  GAIT:  Comments: decreased step length     TODAY'S TREATMENT: 08/02/2022: Nustep level 3 x8 min with PT present to discuss status 4  way pelvic tilt seated on dynadisc x20 each Seated piriformis stretch 2x20 sec bilat Seated hamstring table stretch 2x20 sec bilat Hip Martrix 40# for hip abduction and hip extension 2x10 bilat Leg press (seat at 7) 90# 2x10 Rocker board x2 min Resisted regrogait with 10# pulleys x10 Bilateral single leg stance    07/26/2022: Nustep level 5 x6 min with PT present to discuss status 4 way pelvic tilt seated on dynadisc x20 each Trigger Point Dry-Needling  Treatment instructions: Expect mild to moderate muscle soreness. S/S of pneumothorax if dry needled over a lung field, and to seek immediate medical attention should they occur. Patient verbalized understanding of these instructions and education. Patient  Consent Given: Yes Education handout provided: Previously provided Muscles treated: right glute, right piriformis, right TFL Electrical stimulation performed: No Parameters: N/A Treatment response/outcome: Utilized skilled palpation to identify trigger points.  Able to palpate twitch response and muscle elongation following. Manual Therapy:  Addaday to right glutes, IT band/TFL, right hamstring Seated hamstring stretch 2x20 sec bilat Seated piriformis stretch 2x20 sec bilat Hip Martrix 40# for hip abduction and hip extension x10 bilat Leg press 90# 2x10 Rocker board x2 min FWD step ups 6" without UE use x10 bilat   07/18/2022: Nustep level 5 x6 min with PT present to discuss status Seated hamstring stretch 2x20 sec bilat Seated piriformis stretch 2x20 sec bilat 4 way pelvic tilt seated on dynadisc x20 each Standing at barre:  hip flexion, hip abduction, hip extension with blue tband 2x10 bilat Wall slides 2x10 Standing at step hip flexor stretch 2x20 sec bilat    PATIENT EDUCATION:  Education details: Issued HEP and provided red theraband Person educated: Patient Education method: Explanation, Demonstration, and Handouts Education comprehension: verbalized understanding   HOME EXERCISE PROGRAM: Access Code: [email or web address] URL: [email or web address] Date: 07/11/2022 Prepared by: Shelby Dubin Ashanti Ratti  Exercises - Seated Figure 4 Piriformis Stretch (Mirrored)  - 1 x daily - 7 x weekly - 1 sets - 3 reps - 30 hold - Supine Piriformis Stretch with Leg Straight  - 1 x daily - 7 x weekly - 1 sets - 3 reps - 30 hold - Supine Butterfly Groin Stretch  - 1 x daily - 7 x weekly - 1 sets - 1 reps - 30 hold - Supine Bridge with Resistance Band  - 1 x daily - 7 x weekly - 2 sets - 10 reps - Hooklying Clamshell with Resistance  - 1 x daily - 7 x weekly - 2 sets - 10 reps - Standing Hip Flexion with Resistance Loop  - 1 x daily - 7 x weekly - 2 sets - 10 reps - Hip Abduction with  Resistance Loop  - 1 x daily - 7 x weekly - 2 sets - 10 reps - Hip Extension with Resistance Loop  - 1 x daily - 7 x weekly - 2 sets - 10 reps - Standing Hamstring Stretch with Step  - 1 x daily - 7 x weekly - 1 sets - 2 reps - 20 sec hold  ASSESSMENT:  CLINICAL IMPRESSION: Ms Montecalvo reports that she has been feeling better since dry needling session last visit.  States that she only had a brief hip catch this morning, but postulates that she may have slept in a bad position.  Pt able to progress with strengthening and balance exercises during session today with increased overall hip strength noted.  If pt continues with noted gains, may discharge next visit to continue with HEP.  OBJECTIVE IMPAIRMENTS decreased activity tolerance, difficulty walking, decreased ROM, decreased strength, hypomobility, increased fascial restrictions, and pain.   ACTIVITY LIMITATIONS carrying, lifting, sitting, standing, and locomotion level  PARTICIPATION LIMITATIONS: community activity, occupation, and recreational activities  [place] general health  will positively affect patient's functional outcome.   REHAB POTENTIAL: Good  CLINICAL DECISION MAKING: Stable/uncomplicated  EVALUATION COMPLEXITY: Low   GOALS: Goals reviewed with patient? Yes  SHORT TERM GOALS: Target date: 07/31/2022  The patient will demonstrate knowledge of basic self care strategies and exercises to promote healing   Baseline: Goal status: MET on 07/15/2022  2.  The patient will report a 40% improvement in pain levels with functional activities which are currently difficult including    Baseline:  Goal status: MET on 07/15/2022  3.  The patient will have improved hip strength to at least 4/5 needed for standing, walking longer distances and descending stairs at home and in the community  Baseline:  Goal status: MET on 08/02/2022  4.  The patient will have improved LE strength to rise from a standard chair  without UE use 1x Baseline:  Goal status: MET on 07/26/2022    LONG TERM GOALS: Target date: 08/28/2022   The patient will be independent in a safe self progression of a home exercise program to promote further recovery of function   Baseline:  Goal status: IN PROGRESS  2.  The patient will report a 75% improvement in pain levels with functional activities which are currently difficult   Baseline:  Goal status: IN PROGRESS  3.  The patient will have improved hip strength to at least 4+/5 needed for standing, walking longer distances and descending stairs at home and in the community  Baseline:  Goal status: INITIAL  4.  5x sit to stand test improved to 20 sec or less with slightly higher chair (4 inch cushion) or 25 sec with min use of UEs in standard height chair Baseline:  Goal status: INITIAL  5.  The patient will have improved FOTO score to  76%     indicating improved function with less pain  Baseline:  Goal status: INITIAL     PLAN: PT FREQUENCY: 2x/week  PT DURATION: 8 weeks  PLANNED INTERVENTIONS: Therapeutic exercises, Therapeutic activity, Neuromuscular re-education, Balance training, Gait training, Patient/Family education, Self Care, Joint mobilization, Aquatic Therapy, Dry Needling, Cryotherapy, Moist heat, Taping, Ultrasound, Ionotophoresis 31m/ml Dexamethasone, Manual therapy, and Re-evaluation  PLAN FOR NEXT SESSION:  Manual therapy and/or dry needling to right glutes/piriformis as needed;  right hip mobilizations with emphasis on increasing hip external rotation;  update and assess HEP as indicated; low level glute strengthening    PHYSICAL THERAPY DISCHARGE SUMMARY  As of 10/16/2022, pt has not returned for further PT and discharged at this time.  Patient agrees to discharge. Patient goals were partially met. Patient is being discharged due to not returning since the last visit.    SJuel Burrow PT 08/02/22 10:58 AM  [facility] [address] [place] [address]Phone # [phone] [phone]

## 2022-08-16 ENCOUNTER — Ambulatory Visit: Payer: Medicare HMO | Admitting: Rehabilitative and Restorative Service Providers"

## 2022-08-26 ENCOUNTER — Encounter: Payer: No Typology Code available for payment source | Admitting: Rehabilitative and Restorative Service Providers"

## 2022-09-20 DIAGNOSIS — E785 Hyperlipidemia, unspecified: Secondary | ICD-10-CM | POA: Diagnosis not present

## 2022-09-20 DIAGNOSIS — E538 Deficiency of other specified B group vitamins: Secondary | ICD-10-CM | POA: Diagnosis not present

## 2022-09-23 DIAGNOSIS — R69 Illness, unspecified: Secondary | ICD-10-CM | POA: Diagnosis not present

## 2022-09-23 DIAGNOSIS — M1990 Unspecified osteoarthritis, unspecified site: Secondary | ICD-10-CM | POA: Diagnosis not present

## 2022-09-23 DIAGNOSIS — Z23 Encounter for immunization: Secondary | ICD-10-CM | POA: Diagnosis not present

## 2022-09-23 DIAGNOSIS — R7303 Prediabetes: Secondary | ICD-10-CM | POA: Diagnosis not present

## 2022-10-25 DIAGNOSIS — J0100 Acute maxillary sinusitis, unspecified: Secondary | ICD-10-CM | POA: Diagnosis not present

## 2022-10-25 DIAGNOSIS — R42 Dizziness and giddiness: Secondary | ICD-10-CM | POA: Diagnosis not present

## 2022-10-25 DIAGNOSIS — R69 Illness, unspecified: Secondary | ICD-10-CM | POA: Diagnosis not present

## 2022-11-04 DIAGNOSIS — D1801 Hemangioma of skin and subcutaneous tissue: Secondary | ICD-10-CM | POA: Diagnosis not present

## 2022-11-04 DIAGNOSIS — M71342 Other bursal cyst, left hand: Secondary | ICD-10-CM | POA: Diagnosis not present

## 2022-11-04 DIAGNOSIS — L821 Other seborrheic keratosis: Secondary | ICD-10-CM | POA: Diagnosis not present

## 2022-11-04 DIAGNOSIS — L570 Actinic keratosis: Secondary | ICD-10-CM | POA: Diagnosis not present

## 2022-11-04 DIAGNOSIS — D485 Neoplasm of uncertain behavior of skin: Secondary | ICD-10-CM | POA: Diagnosis not present

## 2022-11-04 DIAGNOSIS — L814 Other melanin hyperpigmentation: Secondary | ICD-10-CM | POA: Diagnosis not present

## 2022-11-04 DIAGNOSIS — L988 Other specified disorders of the skin and subcutaneous tissue: Secondary | ICD-10-CM | POA: Diagnosis not present

## 2022-11-27 ENCOUNTER — Encounter (HOSPITAL_BASED_OUTPATIENT_CLINIC_OR_DEPARTMENT_OTHER): Payer: Self-pay

## 2022-11-27 ENCOUNTER — Other Ambulatory Visit: Payer: Self-pay

## 2022-11-27 DIAGNOSIS — S0181XA Laceration without foreign body of other part of head, initial encounter: Secondary | ICD-10-CM | POA: Insufficient documentation

## 2022-11-27 DIAGNOSIS — Z5321 Procedure and treatment not carried out due to patient leaving prior to being seen by health care provider: Secondary | ICD-10-CM | POA: Insufficient documentation

## 2022-11-27 DIAGNOSIS — W010XXA Fall on same level from slipping, tripping and stumbling without subsequent striking against object, initial encounter: Secondary | ICD-10-CM | POA: Insufficient documentation

## 2022-11-27 NOTE — ED Triage Notes (Signed)
POV from home, amb to triage, GCS 15, A&O x 4.  Pt sts that she tripped and fell onto brick, has two 2-3 cm laceration to right side of forehead, bleeding controlled, denies LOC or blood thinners.

## 2022-11-28 ENCOUNTER — Emergency Department (HOSPITAL_BASED_OUTPATIENT_CLINIC_OR_DEPARTMENT_OTHER)
Admission: EM | Admit: 2022-11-28 | Discharge: 2022-11-28 | Discharge status: Against Medical Advice | Payer: Medicare HMO | Attending: Emergency Medicine | Admitting: Emergency Medicine

## 2022-11-28 ENCOUNTER — Emergency Department (HOSPITAL_BASED_OUTPATIENT_CLINIC_OR_DEPARTMENT_OTHER): Payer: Medicare HMO

## 2022-11-28 DIAGNOSIS — S0181XA Laceration without foreign body of other part of head, initial encounter: Secondary | ICD-10-CM | POA: Diagnosis not present

## 2022-11-28 DIAGNOSIS — Z9181 History of falling: Secondary | ICD-10-CM | POA: Diagnosis not present

## 2022-11-28 DIAGNOSIS — W19XXXA Unspecified fall, initial encounter: Secondary | ICD-10-CM | POA: Diagnosis not present

## 2022-12-02 DIAGNOSIS — M79641 Pain in right hand: Secondary | ICD-10-CM | POA: Diagnosis not present

## 2022-12-02 DIAGNOSIS — M7062 Trochanteric bursitis, left hip: Secondary | ICD-10-CM | POA: Diagnosis not present

## 2022-12-02 DIAGNOSIS — M25531 Pain in right wrist: Secondary | ICD-10-CM | POA: Diagnosis not present

## 2022-12-02 DIAGNOSIS — M7061 Trochanteric bursitis, right hip: Secondary | ICD-10-CM | POA: Diagnosis not present

## 2022-12-10 DIAGNOSIS — M7061 Trochanteric bursitis, right hip: Secondary | ICD-10-CM | POA: Diagnosis not present

## 2022-12-16 DIAGNOSIS — Z1231 Encounter for screening mammogram for malignant neoplasm of breast: Secondary | ICD-10-CM | POA: Diagnosis not present

## 2022-12-17 DIAGNOSIS — M7061 Trochanteric bursitis, right hip: Secondary | ICD-10-CM | POA: Diagnosis not present

## 2022-12-19 DIAGNOSIS — M7061 Trochanteric bursitis, right hip: Secondary | ICD-10-CM | POA: Diagnosis not present

## 2022-12-23 DIAGNOSIS — M79644 Pain in right finger(s): Secondary | ICD-10-CM | POA: Diagnosis not present

## 2022-12-24 DIAGNOSIS — M7061 Trochanteric bursitis, right hip: Secondary | ICD-10-CM | POA: Diagnosis not present

## 2022-12-26 DIAGNOSIS — M7061 Trochanteric bursitis, right hip: Secondary | ICD-10-CM | POA: Diagnosis not present

## 2022-12-27 DIAGNOSIS — R928 Other abnormal and inconclusive findings on diagnostic imaging of breast: Secondary | ICD-10-CM | POA: Diagnosis not present

## 2022-12-31 DIAGNOSIS — M7061 Trochanteric bursitis, right hip: Secondary | ICD-10-CM | POA: Diagnosis not present

## 2023-01-02 DIAGNOSIS — M7061 Trochanteric bursitis, right hip: Secondary | ICD-10-CM | POA: Diagnosis not present

## 2023-01-07 DIAGNOSIS — M7061 Trochanteric bursitis, right hip: Secondary | ICD-10-CM | POA: Diagnosis not present

## 2023-01-07 DIAGNOSIS — M7062 Trochanteric bursitis, left hip: Secondary | ICD-10-CM | POA: Diagnosis not present

## 2023-01-14 DIAGNOSIS — M7062 Trochanteric bursitis, left hip: Secondary | ICD-10-CM | POA: Diagnosis not present

## 2023-01-14 DIAGNOSIS — M7061 Trochanteric bursitis, right hip: Secondary | ICD-10-CM | POA: Diagnosis not present

## 2023-01-21 DIAGNOSIS — M7062 Trochanteric bursitis, left hip: Secondary | ICD-10-CM | POA: Diagnosis not present

## 2023-01-21 DIAGNOSIS — M7061 Trochanteric bursitis, right hip: Secondary | ICD-10-CM | POA: Diagnosis not present

## 2023-01-24 DIAGNOSIS — M7061 Trochanteric bursitis, right hip: Secondary | ICD-10-CM | POA: Diagnosis not present

## 2023-01-24 DIAGNOSIS — M7062 Trochanteric bursitis, left hip: Secondary | ICD-10-CM | POA: Diagnosis not present

## 2023-01-27 DIAGNOSIS — R69 Illness, unspecified: Secondary | ICD-10-CM | POA: Diagnosis not present

## 2023-01-28 DIAGNOSIS — M7061 Trochanteric bursitis, right hip: Secondary | ICD-10-CM | POA: Diagnosis not present

## 2023-01-28 DIAGNOSIS — M7062 Trochanteric bursitis, left hip: Secondary | ICD-10-CM | POA: Diagnosis not present

## 2023-01-30 DIAGNOSIS — M7061 Trochanteric bursitis, right hip: Secondary | ICD-10-CM | POA: Diagnosis not present

## 2023-01-30 DIAGNOSIS — M7062 Trochanteric bursitis, left hip: Secondary | ICD-10-CM | POA: Diagnosis not present

## 2023-02-04 DIAGNOSIS — M7062 Trochanteric bursitis, left hip: Secondary | ICD-10-CM | POA: Diagnosis not present

## 2023-02-04 DIAGNOSIS — M7061 Trochanteric bursitis, right hip: Secondary | ICD-10-CM | POA: Diagnosis not present

## 2023-02-06 DIAGNOSIS — M7062 Trochanteric bursitis, left hip: Secondary | ICD-10-CM | POA: Diagnosis not present

## 2023-02-06 DIAGNOSIS — M7061 Trochanteric bursitis, right hip: Secondary | ICD-10-CM | POA: Diagnosis not present

## 2023-02-10 DIAGNOSIS — M7061 Trochanteric bursitis, right hip: Secondary | ICD-10-CM | POA: Diagnosis not present

## 2023-02-10 DIAGNOSIS — M7062 Trochanteric bursitis, left hip: Secondary | ICD-10-CM | POA: Diagnosis not present

## 2023-02-28 DIAGNOSIS — Z03818 Encounter for observation for suspected exposure to other biological agents ruled out: Secondary | ICD-10-CM | POA: Diagnosis not present

## 2023-02-28 DIAGNOSIS — R059 Cough, unspecified: Secondary | ICD-10-CM | POA: Diagnosis not present

## 2023-02-28 DIAGNOSIS — J208 Acute bronchitis due to other specified organisms: Secondary | ICD-10-CM | POA: Diagnosis not present

## 2023-03-05 DIAGNOSIS — J40 Bronchitis, not specified as acute or chronic: Secondary | ICD-10-CM | POA: Diagnosis not present

## 2023-04-22 DIAGNOSIS — R6889 Other general symptoms and signs: Secondary | ICD-10-CM | POA: Diagnosis not present

## 2023-04-22 DIAGNOSIS — Z8489 Family history of other specified conditions: Secondary | ICD-10-CM | POA: Diagnosis not present

## 2023-04-22 DIAGNOSIS — R29898 Other symptoms and signs involving the musculoskeletal system: Secondary | ICD-10-CM | POA: Diagnosis not present

## 2023-04-22 DIAGNOSIS — R413 Other amnesia: Secondary | ICD-10-CM | POA: Diagnosis not present

## 2023-04-22 DIAGNOSIS — Z789 Other specified health status: Secondary | ICD-10-CM | POA: Diagnosis not present

## 2023-04-22 DIAGNOSIS — R296 Repeated falls: Secondary | ICD-10-CM | POA: Diagnosis not present

## 2023-04-23 DIAGNOSIS — E785 Hyperlipidemia, unspecified: Secondary | ICD-10-CM | POA: Diagnosis not present

## 2023-04-23 DIAGNOSIS — Z008 Encounter for other general examination: Secondary | ICD-10-CM | POA: Diagnosis not present

## 2023-04-23 DIAGNOSIS — M1990 Unspecified osteoarthritis, unspecified site: Secondary | ICD-10-CM | POA: Diagnosis not present

## 2023-04-23 DIAGNOSIS — M8580 Other specified disorders of bone density and structure, unspecified site: Secondary | ICD-10-CM | POA: Diagnosis not present

## 2023-04-23 DIAGNOSIS — M2040 Other hammer toe(s) (acquired), unspecified foot: Secondary | ICD-10-CM | POA: Diagnosis not present

## 2023-04-23 DIAGNOSIS — R269 Unspecified abnormalities of gait and mobility: Secondary | ICD-10-CM | POA: Diagnosis not present

## 2023-04-23 DIAGNOSIS — K219 Gastro-esophageal reflux disease without esophagitis: Secondary | ICD-10-CM | POA: Diagnosis not present

## 2023-04-23 DIAGNOSIS — D6949 Other primary thrombocytopenia: Secondary | ICD-10-CM | POA: Diagnosis not present

## 2023-04-23 DIAGNOSIS — Z9181 History of falling: Secondary | ICD-10-CM | POA: Diagnosis not present

## 2023-04-23 DIAGNOSIS — F419 Anxiety disorder, unspecified: Secondary | ICD-10-CM | POA: Diagnosis not present

## 2023-04-23 DIAGNOSIS — F320 Major depressive disorder, single episode, mild: Secondary | ICD-10-CM | POA: Diagnosis not present

## 2023-05-14 DIAGNOSIS — R296 Repeated falls: Secondary | ICD-10-CM | POA: Diagnosis not present

## 2023-05-14 DIAGNOSIS — R531 Weakness: Secondary | ICD-10-CM | POA: Diagnosis not present

## 2023-06-23 DIAGNOSIS — R7303 Prediabetes: Secondary | ICD-10-CM | POA: Diagnosis not present

## 2023-06-23 DIAGNOSIS — Z0000 Encounter for general adult medical examination without abnormal findings: Secondary | ICD-10-CM | POA: Diagnosis not present

## 2023-06-23 DIAGNOSIS — E785 Hyperlipidemia, unspecified: Secondary | ICD-10-CM | POA: Diagnosis not present

## 2023-06-25 DIAGNOSIS — Z9181 History of falling: Secondary | ICD-10-CM | POA: Diagnosis not present

## 2023-06-25 DIAGNOSIS — Z0000 Encounter for general adult medical examination without abnormal findings: Secondary | ICD-10-CM | POA: Diagnosis not present

## 2023-06-25 DIAGNOSIS — F3341 Major depressive disorder, recurrent, in partial remission: Secondary | ICD-10-CM | POA: Diagnosis not present

## 2023-06-25 DIAGNOSIS — E2839 Other primary ovarian failure: Secondary | ICD-10-CM | POA: Diagnosis not present

## 2023-06-25 DIAGNOSIS — Z23 Encounter for immunization: Secondary | ICD-10-CM | POA: Diagnosis not present

## 2023-06-25 DIAGNOSIS — R197 Diarrhea, unspecified: Secondary | ICD-10-CM | POA: Diagnosis not present

## 2023-06-25 DIAGNOSIS — E785 Hyperlipidemia, unspecified: Secondary | ICD-10-CM | POA: Diagnosis not present

## 2023-06-25 DIAGNOSIS — R799 Abnormal finding of blood chemistry, unspecified: Secondary | ICD-10-CM | POA: Diagnosis not present

## 2023-06-25 DIAGNOSIS — R7303 Prediabetes: Secondary | ICD-10-CM | POA: Diagnosis not present

## 2023-06-27 ENCOUNTER — Other Ambulatory Visit: Payer: Self-pay | Admitting: Family Medicine

## 2023-06-27 DIAGNOSIS — E2839 Other primary ovarian failure: Secondary | ICD-10-CM

## 2023-08-19 DIAGNOSIS — R197 Diarrhea, unspecified: Secondary | ICD-10-CM | POA: Diagnosis not present

## 2023-09-09 ENCOUNTER — Other Ambulatory Visit (HOSPITAL_COMMUNITY): Payer: Self-pay

## 2023-09-09 ENCOUNTER — Telehealth (HOSPITAL_BASED_OUTPATIENT_CLINIC_OR_DEPARTMENT_OTHER): Payer: Self-pay

## 2023-09-09 ENCOUNTER — Encounter (HOSPITAL_BASED_OUTPATIENT_CLINIC_OR_DEPARTMENT_OTHER): Payer: Self-pay | Admitting: Internal Medicine

## 2023-09-09 ENCOUNTER — Ambulatory Visit (HOSPITAL_BASED_OUTPATIENT_CLINIC_OR_DEPARTMENT_OTHER): Payer: Medicare HMO | Admitting: Internal Medicine

## 2023-09-09 ENCOUNTER — Telehealth (HOSPITAL_BASED_OUTPATIENT_CLINIC_OR_DEPARTMENT_OTHER): Payer: Self-pay | Admitting: Pharmacy Technician

## 2023-09-09 VITALS — BP 128/88 | HR 80 | Ht 68.0 in | Wt 189.6 lb

## 2023-09-09 DIAGNOSIS — M791 Myalgia, unspecified site: Secondary | ICD-10-CM | POA: Diagnosis not present

## 2023-09-09 DIAGNOSIS — I6523 Occlusion and stenosis of bilateral carotid arteries: Secondary | ICD-10-CM

## 2023-09-09 DIAGNOSIS — T466X5D Adverse effect of antihyperlipidemic and antiarteriosclerotic drugs, subsequent encounter: Secondary | ICD-10-CM

## 2023-09-09 DIAGNOSIS — E785 Hyperlipidemia, unspecified: Secondary | ICD-10-CM

## 2023-09-09 DIAGNOSIS — T466X5A Adverse effect of antihyperlipidemic and antiarteriosclerotic drugs, initial encounter: Secondary | ICD-10-CM

## 2023-09-09 MED ORDER — REPATHA SURECLICK 140 MG/ML ~~LOC~~ SOAJ: 140.0000 mg | SUBCUTANEOUS | 6 refills | Status: DC

## 2023-09-09 NOTE — Progress Notes (Unsigned)
LIPID CLINIC CONSULT NOTE  Chief Complaint:  Manage dyslipidemia  Primary Care Physician: Marisa Mylar, MD  Primary Cardiologist:  None  HPI:  Marisa Wilson is a 72 y.o. female who is being seen today for the evaluation of dyslipidemia at the request of Marisa Patience, FNP.  This a pleasant 72 year old female kindly referred for evaluation management of dyslipidemia.  Specifically, she has a history of elevated LDL cholesterol back in September of 185, total cholesterol 265, HDL 52 and triglycerides 151.  She recently was placed on pravastatin 20 mg daily however had side effects with this including myalgias.  She had previously tried other statins including simvastatin in the past as well as probably atorvastatin which caused her similar side effects.  She is referred today for further evaluation and management.  She does have a history of mild carotid atherosclerosis noted by carotid ultrasound back in 2023.  PMHx:  Past Medical History:  Diagnosis Date   Hyperlipidemia    Osteoarthritis    Prediabetes    Statin intolerance     No past surgical history on file.  FAMHx:  History reviewed. No pertinent family history.  SOCHx:   reports that she has never smoked. She has never used smokeless tobacco. No history on file for alcohol use and drug use.  ALLERGIES:  Allergies  Allergen Reactions   Codeine Nausea And Vomiting    ROS: Pertinent items noted in HPI and remainder of comprehensive ROS otherwise negative.  HOME MEDS: Current Outpatient Medications on File Prior to Visit  Medication Sig Dispense Refill   citalopram (CELEXA) 20 MG tablet Take 20 mg by mouth daily. (Patient not taking: Reported on 09/09/2023)     pravastatin (PRAVACHOL) 20 MG tablet Take 20 mg by mouth daily.     No current facility-administered medications on file prior to visit.    LABS/IMAGING: No results found for this or any previous visit (from the past 48 hour(s)). No results  found.  LIPID PANEL: No results found for: "CHOL", "TRIG", "HDL", "CHOLHDL", "VLDL", "LDLCALC", "LDLDIRECT"  WEIGHTS: Wt Readings from Last 3 Encounters:  09/09/23 189 lb 9.6 oz (86 kg)  11/27/22 180 lb (81.6 kg)    VITALS: BP 128/88 (BP Location: Left Arm, Patient Position: Sitting, Cuff Size: Normal)   Pulse 80   Ht 5\' 8"  (1.727 m)   Wt 189 lb 9.6 oz (86 kg)   SpO2 98%   BMI 28.83 kg/m   EXAM: Deferred  EKG: Deferred  ASSESSMENT: Dyslipidemia with high LDL cholesterol Statin intolerance-myalgias PAD with minimal bilateral atherosclerotic carotid plaque (2023)  PLAN: 1.   Marisa Wilson has dyslipidemia with a high LDL cholesterol.  She has been intolerant to a number of statins and does have some atherosclerotic plaque in her carotid arteries.  I would recommend targeting her LDL to less than 70 or at least 50% reduction with therapy although she cannot tolerate statins.  We discussed other options for her today including ezetimibe, bempedoic acid or the PCSK9 inhibitors.  She is a good candidate to proceed with PCSK9 inhibition due to the marked reduction in cholesterol afforded by that.  Would recommend starting Repatha 140 mg every 2 weeks.  Will reach out for prior authorization.  Plan a lipid NMR and LP(a) reassessment.  She can follow-up with me in about 3 to 4 months.  Thanks again for the kind referral.  Marisa Nose, MD, Marisa Wilson  Marisa Wilson  [facility]  Medical Director  of the [facility]  Card[facility]ovascular [facility] Reduction Clinic Diplomate of the American Board of Clinical Lipidology Attending Cardiologist  Direct Dial: [phone]  Fax: [phone]  Website:  [email or web address] 09/11/2023, 12:37 PM

## 2023-09-09 NOTE — Telephone Encounter (Signed)
Pharmacy Patient Advocate Encounter   Received notification from Pt Calls Messages that prior authorization for repatha is required/requested.   Insurance verification completed.   The patient is insured through [facility] .   Per test claim: PA required; PA submitted to above mentioned insurance via CoverMyMeds Key/confirmation #/EOC [facility] Status is pending

## 2023-09-09 NOTE — Patient Instructions (Signed)
Medication Instructions:  Your physician has recommended you make the following change in your medication:   START Repatha (this will likely need prior authorization. We will contact you when it's ready for pick up!)  *If you need a refill on your cardiac medications before your next appointment, please call your pharmacy*   Lab Work: NMR, Lpa  If you have labs (blood work) drawn today and your tests are completely normal, you will receive your results only by: MyChart Message (if you have MyChart) OR A paper copy in the mail If you have any lab test that is abnormal or we need to change your treatment, we will call you to review the results.   Follow-Up: At [facility], you and your health needs are our priority.  As part of our continuing mission to provide you with exceptional heart care, we have created designated Provider Care Teams.  These Care Teams include your primary Cardiologist (physician) and Advanced Practice Providers (APPs -  Physician Assistants and Nurse Practitioners) who all work together to provide you with the care you need, when you need it.  We recommend signing up for the patient portal called "MyChart".  Sign up information is provided on this After Visit Summary.  MyChart is used to connect with patients for Virtual Visits (Telemedicine).  Patients are able to view lab/test results, encounter notes, upcoming appointments, etc.  Non-urgent messages can be sent to your provider as well.   To learn more about what you can do with MyChart, go to [email or web address].    Your next appointment:   3-4 month(s)  Provider:   Chrystie Nose, MD

## 2023-09-09 NOTE — Telephone Encounter (Signed)
Prior auth sent in; status pending.

## 2023-09-10 ENCOUNTER — Other Ambulatory Visit (HOSPITAL_COMMUNITY): Payer: Self-pay

## 2023-09-10 ENCOUNTER — Encounter (HOSPITAL_BASED_OUTPATIENT_CLINIC_OR_DEPARTMENT_OTHER): Payer: Self-pay

## 2023-09-10 DIAGNOSIS — T466X5A Adverse effect of antihyperlipidemic and antiarteriosclerotic drugs, initial encounter: Secondary | ICD-10-CM

## 2023-09-10 DIAGNOSIS — E785 Hyperlipidemia, unspecified: Secondary | ICD-10-CM

## 2023-09-10 DIAGNOSIS — I6523 Occlusion and stenosis of bilateral carotid arteries: Secondary | ICD-10-CM

## 2023-09-10 NOTE — Telephone Encounter (Signed)
Pharmacy Patient Advocate Encounter  Received notification from [facility] that Prior Authorization for repatha has been APPROVED from 09/09/23 to 10/07/23  - will need another at the new year   PA #/Case ID/Reference #: [id number]

## 2023-09-11 ENCOUNTER — Encounter (HOSPITAL_BASED_OUTPATIENT_CLINIC_OR_DEPARTMENT_OTHER): Payer: Self-pay | Admitting: Internal Medicine

## 2023-10-07 ENCOUNTER — Telehealth: Payer: Self-pay | Admitting: Pharmacy Technician

## 2023-10-07 ENCOUNTER — Other Ambulatory Visit (HOSPITAL_COMMUNITY): Payer: Self-pay

## 2023-10-07 ENCOUNTER — Other Ambulatory Visit (HOSPITAL_BASED_OUTPATIENT_CLINIC_OR_DEPARTMENT_OTHER): Payer: Self-pay

## 2023-10-07 MED ORDER — REPATHA SURECLICK 140 MG/ML ~~LOC~~ SOAJ
140.0000 mg | SUBCUTANEOUS | 6 refills | Status: DC
  Filled 2023-10-07: qty 2, 28d supply, fill #0

## 2023-10-07 NOTE — Telephone Encounter (Signed)
 Pharmacy Patient Advocate Encounter   Received notification from Patient Advice Request messages that prior authorization for repatha  is required/requested.   Insurance verification completed.   The patient is insured through [employer] .   Per test claim: Refill too soon. PA is not needed at this time. Medication was filled 10/06/23. Next eligible fill date is 10/26/23.   PA on file was updated and expires 10/06/24

## 2023-11-07 DIAGNOSIS — L814 Other melanin hyperpigmentation: Secondary | ICD-10-CM | POA: Diagnosis not present

## 2023-11-07 DIAGNOSIS — D1801 Hemangioma of skin and subcutaneous tissue: Secondary | ICD-10-CM | POA: Diagnosis not present

## 2023-11-07 DIAGNOSIS — L570 Actinic keratosis: Secondary | ICD-10-CM | POA: Diagnosis not present

## 2023-11-07 DIAGNOSIS — D225 Melanocytic nevi of trunk: Secondary | ICD-10-CM | POA: Diagnosis not present

## 2023-11-07 DIAGNOSIS — L821 Other seborrheic keratosis: Secondary | ICD-10-CM | POA: Diagnosis not present

## 2023-11-07 DIAGNOSIS — D0462 Carcinoma in situ of skin of left upper limb, including shoulder: Secondary | ICD-10-CM | POA: Diagnosis not present

## 2023-11-11 DIAGNOSIS — R197 Diarrhea, unspecified: Secondary | ICD-10-CM | POA: Diagnosis not present

## 2023-11-20 ENCOUNTER — Institutional Professional Consult (permissible substitution) (HOSPITAL_BASED_OUTPATIENT_CLINIC_OR_DEPARTMENT_OTHER): Payer: Medicare HMO | Admitting: Internal Medicine

## 2023-12-23 DIAGNOSIS — Z1231 Encounter for screening mammogram for malignant neoplasm of breast: Secondary | ICD-10-CM | POA: Diagnosis not present

## 2024-01-01 LAB — NMR, LIPOPROFILE
Cholesterol, Total: 168 mg/dL (ref 100–199)
HDL Particle Number: 40.3 umol/L (ref >=30.5)
HDL-C: 63 mg/dL (ref >39)
LDL Particle Number: 764 nmol/L (ref <1000)
LDL Size: 21.2 nm (ref >20.5)
LDL-C (NIH Calc): 89 mg/dL (ref 0–99)
LP-IR Score: 37 (ref <=45)
Small LDL Particle Number: 289 nmol/L (ref <=527)
Triglycerides: 87 mg/dL (ref 0–149)

## 2024-01-01 LAB — LIPOPROTEIN A (LPA): Lipoprotein (a): <8.4 nmol/L (ref <75.0)

## 2024-01-05 ENCOUNTER — Encounter: Payer: Self-pay | Admitting: *Deleted

## 2024-01-09 ENCOUNTER — Ambulatory Visit (HOSPITAL_BASED_OUTPATIENT_CLINIC_OR_DEPARTMENT_OTHER): Payer: Medicare HMO | Admitting: Internal Medicine

## 2024-01-09 VITALS — BP 126/84 | HR 78 | Ht 68.0 in | Wt 185.0 lb

## 2024-01-09 DIAGNOSIS — M791 Myalgia, unspecified site: Secondary | ICD-10-CM

## 2024-01-09 DIAGNOSIS — T466X5D Adverse effect of antihyperlipidemic and antiarteriosclerotic drugs, subsequent encounter: Secondary | ICD-10-CM | POA: Diagnosis not present

## 2024-01-09 DIAGNOSIS — I6523 Occlusion and stenosis of bilateral carotid arteries: Secondary | ICD-10-CM

## 2024-01-09 DIAGNOSIS — E785 Hyperlipidemia, unspecified: Secondary | ICD-10-CM

## 2024-01-09 NOTE — Progress Notes (Signed)
 LIPID CLINIC CONSULT NOTE  Chief Complaint:  Follow-up dyslipidemia  Primary Care Physician: Shirlean Mylar, MD  Primary Cardiologist:  None  HPI:  Marisa Wilson is a 73 y.o. female who is being seen today for the evaluation of dyslipidemia at the request of Shirlean Mylar, MD.  This a pleasant 73 year old female kindly referred for evaluation management of dyslipidemia.  Specifically, she has a history of elevated LDL cholesterol back in September of 185, total cholesterol 265, HDL 52 and triglycerides 151.  She recently was placed on pravastatin 20 mg daily however had side effects with this including myalgias.  She had previously tried other statins including simvastatin in the past as well as probably atorvastatin which caused her similar side effects.  She is referred today for further evaluation and management.  She does have a history of mild carotid atherosclerosis noted by carotid ultrasound back in 2023.  01/09/2024  Marisa Wilson is seen today for follow-up of dyslipidemia.  Her lab work shows substantial improvement in her cholesterol.  LDL particle number is now 764 with an LDL of 89, HDL 63 and triglycerides 87.  Small LDL particle number is only 289.  Her LP(a) was negative.  Her LDL previously was 185 therefore substantial reduction in cholesterol.  Although the LDL is not quite at target of 70 her particle numbers look good.  PMHx:  Past Medical History:  Diagnosis Date   Hyperlipidemia    Osteoarthritis    Prediabetes    Statin intolerance     No past surgical history on file.  FAMHx:  No family history on file.  SOCHx:   reports that she has never smoked. She has never used smokeless tobacco. No history on file for alcohol use and drug use.  ALLERGIES:  Allergies  Allergen Reactions   Codeine Nausea And Vomiting    ROS: Pertinent items noted in HPI and remainder of comprehensive ROS otherwise negative.  HOME MEDS: Current Outpatient Medications on File  Prior to Visit  Medication Sig Dispense Refill   citalopram (CELEXA) 20 MG tablet Take 20 mg by mouth daily.     colestipol (COLESTID) 1 g tablet Take 1 g by mouth daily.     Evolocumab (REPATHA SURECLICK) 140 MG/ML SOAJ Inject 140 mg into the skin every 14 (fourteen) days. 2 mL 6   No current facility-administered medications on file prior to visit.    LABS/IMAGING: No results found for this or any previous visit (from the past 48 hours). No results found.  LIPID PANEL: No results found for: "CHOL", "TRIG", "HDL", "CHOLHDL", "VLDL", "LDLCALC", "LDLDIRECT"  WEIGHTS: Wt Readings from Last 3 Encounters:  01/09/24 185 lb (83.9 kg)  09/09/23 189 lb 9.6 oz (86 kg)  11/27/22 180 lb (81.6 kg)    VITALS: BP 126/84   Pulse 78   Ht 5\' 8"  (1.727 m)   Wt 185 lb (83.9 kg)   SpO2 97%   BMI 28.13 kg/m   EXAM: Deferred  EKG: Deferred  ASSESSMENT: Dyslipidemia with high LDL cholesterol Statin intolerance-myalgias PAD with minimal bilateral atherosclerotic carotid plaque (2023)  PLAN: 1.   Marisa Wilson has had a substantial reduction in her cholesterol and Repatha.  She is tolerating this well.  I think her cholesterol might even go lower with additional dietary and lifestyle modifications.  I have encouraged her to continue to work on that as well as regular exercise and will continue current therapy with Repatha.  Plan follow-up repeat lipid NMR in  about a year and she can follow-up with our prevention APP.  Chrystie Nose, MD, [facility], FACP  [place]  [facility]  Medical Director of the Advanced Lipid Disorders &  Cardiovascular Risk Reduction Clinic Diplomate of the American Board of Clinical Lipidology Attending Cardiologist  Direct Dial: [phone]  Fax: [phone]  Website:  [email or web address] 01/09/2024, 11:32 AM

## 2024-01-09 NOTE — Patient Instructions (Signed)
 Medication Instructions:  Your physician recommends that you continue on your current medications as directed. Please refer to the Current Medication list given to you today.  *If you need a refill on your cardiac medications before your next appointment, please call your pharmacy*  Lab Work: Your physician recommends that you return for lab work in: 1 year for NMR  If you have labs (blood work) drawn today and your tests are completely normal, you will receive your results only by: MyChart Message (if you have MyChart) OR A paper copy in the mail If you have any lab test that is abnormal or we need to change your treatment, we will call you to review the results.   Follow-Up: At [facility], you and your health needs are our priority.  As part of our continuing mission to provide you with exceptional heart care, our providers are all part of one team.  This team includes your primary Cardiologist (physician) and Advanced Practice Providers or APPs (Physician Assistants and Nurse Practitioners) who all work together to provide you with the care you need, when you need it.  Your next appointment:   12 month(s)  Provider:   Eligha Bridegroom, NP    We recommend signing up for the patient portal called "MyChart".  Sign up information is provided on this After Visit Summary.  MyChart is used to connect with patients for Virtual Visits (Telemedicine).  Patients are able to view lab/test results, encounter notes, upcoming appointments, etc.  Non-urgent messages can be sent to your provider as well.   To learn more about what you can do with MyChart, go to [email or web address].

## 2024-01-12 ENCOUNTER — Ambulatory Visit
Admission: RE | Admit: 2024-01-12 | Discharge: 2024-01-12 | Disposition: A | Discharge status: Home / Self Care | Payer: Medicare HMO | Source: Ambulatory Visit | Attending: Family Medicine | Admitting: Family Medicine

## 2024-01-12 DIAGNOSIS — E2839 Other primary ovarian failure: Secondary | ICD-10-CM

## 2024-01-12 DIAGNOSIS — N958 Other specified menopausal and perimenopausal disorders: Secondary | ICD-10-CM | POA: Diagnosis not present

## 2024-03-20 ENCOUNTER — Other Ambulatory Visit (HOSPITAL_BASED_OUTPATIENT_CLINIC_OR_DEPARTMENT_OTHER): Payer: Self-pay | Admitting: Internal Medicine

## 2024-03-20 DIAGNOSIS — M791 Myalgia, unspecified site: Secondary | ICD-10-CM

## 2024-03-20 DIAGNOSIS — E785 Hyperlipidemia, unspecified: Secondary | ICD-10-CM

## 2024-03-20 DIAGNOSIS — I6523 Occlusion and stenosis of bilateral carotid arteries: Secondary | ICD-10-CM

## 2024-03-22 ENCOUNTER — Telehealth: Payer: Self-pay | Admitting: Pharmacist

## 2024-03-22 MED ORDER — REPATHA SURECLICK 140 MG/ML ~~LOC~~ SOAJ: 140.0000 mg | SUBCUTANEOUS | 3 refills | Status: AC

## 2024-03-22 NOTE — Telephone Encounter (Signed)
 Repatha  refill request received from [employer]. Prescription sent

## 2024-06-23 ENCOUNTER — Ambulatory Visit: Admitting: Podiatry

## 2024-06-23 DIAGNOSIS — Q667 Congenital pes cavus, unspecified foot: Secondary | ICD-10-CM | POA: Diagnosis not present

## 2024-06-23 NOTE — Progress Notes (Signed)
 Sending orthotics out to get refurbished patient will pay $90 today

## 2024-06-23 NOTE — Progress Notes (Signed)
  Subjective:  Patient ID: Marisa Wilson, female    DOB: Aug 23, 1951,  MRN: [id number]  Chief Complaint  Patient presents with   Leonel Toe    Pt stated that she wanted to come and have her feet checked and to have someone look at her orthotics     73 y.o. female presents with the above complaint.  Patient presents for follow-up of left submetatarsal 5 porokeratotic lesion/benign skin lesion.  Is been pain for about a month as progressive gotten worse.  Debridement does help.  She states that she was doing much better however it started hurting her again.  She denies any other acute complaints she would also like to do another pair of orthotics.   Review of Systems: Negative except as noted in the HPI. Denies N/V/F/Ch.  Past Medical History:  Diagnosis Date   Hyperlipidemia    Osteoarthritis    Prediabetes    Statin intolerance     Current Outpatient Medications:    citalopram (CELEXA) 20 MG tablet, Take 20 mg by mouth daily., Disp: , Rfl:    colestipol (COLESTID) 1 g tablet, Take 1 g by mouth daily., Disp: , Rfl:    Evolocumab  (REPATHA  SURECLICK) 140 MG/ML SOAJ, Inject 140 mg into the skin every 14 (fourteen) days., Disp: 6 mL, Rfl: 3  Social History   Tobacco Use  Smoking Status Never  Smokeless Tobacco Never    Allergies  Allergen Reactions   Codeine Nausea And Vomiting   Objective:  There were no vitals filed for this visit. There is no height or weight on file to calculate BMI. Constitutional Well developed. Well nourished.  Vascular Dorsalis pedis pulses palpable bilaterally. Posterior tibial pulses palpable bilaterally. Capillary refill normal to all digits.  No cyanosis or clubbing noted. Pedal hair growth normal.  Neurologic Normal speech. Oriented to person, place, and time. Epicritic sensation to light touch grossly present bilaterally.  Dermatologic Nails well groomed and normal in appearance. No open wounds. No skin lesions.  Orthopedic:  Pain on  palpation to the hyperkeratotic lesions left 5.  Cavus foot structure noted with plantarflexion of the metatarsals and dorsiflexion of the digits with hammertoe contractures of 2 through 5 bilaterally   Radiographs: None Assessment:   No diagnosis found.   Plan:  Patient was evaluated and treated and all questions answered.  Porokeratosis left submetatarsal 5 porokeratosis/benign skin lesion -All questions and concerns were discussed with the patient in extensive detail -As a courtesy using chisel blade to handle the lesion was debrided down to healthy striated tissue. -I discussed with her that she will benefit from custom-made orthotics to take the pressure off of this versus floating osteotomy.  She would like to discuss and think about the options and she will get back to me when she is ready.  Pes cavus -I explained to patient the etiology of pes pcavus and relationship with Planter fasciitis and various treatment options were discussed.  Given patient foot structure in the setting of Planter fasciitis I believe patient will benefit from custom-made orthotics to help control the hindfoot motion support the arch of the foot and take the stress away from plantar fascial.  Patient agrees with the plan like to proceed with orthotics -Patient was casted for orthotics with offloading of bilateral 1st, 3rd and 5th metatarsal phalangeal joint   No follow-ups on file.

## 2024-07-02 DIAGNOSIS — Z23 Encounter for immunization: Secondary | ICD-10-CM | POA: Diagnosis not present

## 2024-07-02 DIAGNOSIS — K219 Gastro-esophageal reflux disease without esophagitis: Secondary | ICD-10-CM | POA: Diagnosis not present

## 2024-07-02 DIAGNOSIS — E785 Hyperlipidemia, unspecified: Secondary | ICD-10-CM | POA: Diagnosis not present

## 2024-07-02 DIAGNOSIS — F3341 Major depressive disorder, recurrent, in partial remission: Secondary | ICD-10-CM | POA: Diagnosis not present

## 2024-07-02 DIAGNOSIS — Z0000 Encounter for general adult medical examination without abnormal findings: Secondary | ICD-10-CM | POA: Diagnosis not present

## 2024-07-02 DIAGNOSIS — R7303 Prediabetes: Secondary | ICD-10-CM | POA: Diagnosis not present

## 2024-07-02 DIAGNOSIS — D709 Neutropenia, unspecified: Secondary | ICD-10-CM | POA: Diagnosis not present

## 2024-09-14 ENCOUNTER — Inpatient Hospital Stay: Attending: Oncology | Admitting: Oncology

## 2024-09-14 ENCOUNTER — Encounter: Payer: Self-pay | Admitting: Oncology

## 2024-09-14 ENCOUNTER — Inpatient Hospital Stay

## 2024-09-14 VITALS — BP 113/71 | HR 67 | Temp 97.6°F | Resp 18 | Ht 68.0 in | Wt 186.2 lb

## 2024-09-14 DIAGNOSIS — M1990 Unspecified osteoarthritis, unspecified site: Secondary | ICD-10-CM | POA: Diagnosis not present

## 2024-09-14 DIAGNOSIS — Z8639 Personal history of other endocrine, nutritional and metabolic disease: Secondary | ICD-10-CM | POA: Insufficient documentation

## 2024-09-14 DIAGNOSIS — D709 Neutropenia, unspecified: Secondary | ICD-10-CM | POA: Insufficient documentation

## 2024-09-14 DIAGNOSIS — Z79899 Other long term (current) drug therapy: Secondary | ICD-10-CM | POA: Diagnosis not present

## 2024-09-14 DIAGNOSIS — F32A Depression, unspecified: Secondary | ICD-10-CM | POA: Insufficient documentation

## 2024-09-14 DIAGNOSIS — D72819 Decreased white blood cell count, unspecified: Secondary | ICD-10-CM

## 2024-09-14 DIAGNOSIS — E538 Deficiency of other specified B group vitamins: Secondary | ICD-10-CM | POA: Diagnosis not present

## 2024-09-14 DIAGNOSIS — E785 Hyperlipidemia, unspecified: Secondary | ICD-10-CM | POA: Diagnosis not present

## 2024-09-14 DIAGNOSIS — R7303 Prediabetes: Secondary | ICD-10-CM | POA: Diagnosis not present

## 2024-09-14 DIAGNOSIS — M810 Age-related osteoporosis without current pathological fracture: Secondary | ICD-10-CM | POA: Insufficient documentation

## 2024-09-14 DIAGNOSIS — F419 Anxiety disorder, unspecified: Secondary | ICD-10-CM | POA: Insufficient documentation

## 2024-09-14 DIAGNOSIS — K219 Gastro-esophageal reflux disease without esophagitis: Secondary | ICD-10-CM | POA: Diagnosis not present

## 2024-09-14 DIAGNOSIS — R197 Diarrhea, unspecified: Secondary | ICD-10-CM | POA: Insufficient documentation

## 2024-09-14 LAB — CBC WITH DIFFERENTIAL (CANCER CENTER ONLY)
Abs Immature Granulocytes: 0.02 K/uL (ref 0.00–0.07)
Basophils Absolute: 0 K/uL (ref 0.0–0.1)
Basophils Relative: 1 %
Eosinophils Absolute: 0 K/uL (ref 0.0–0.5)
Eosinophils Relative: 0 %
HCT: 37.9 % (ref 36.0–46.0)
Hemoglobin: 12.6 g/dL (ref 12.0–15.0)
Immature Granulocytes: 1 %
Lymphocytes Relative: 43 %
Lymphs Abs: 1.5 K/uL (ref 0.7–4.0)
MCH: 30.7 pg (ref 26.0–34.0)
MCHC: 33.2 g/dL (ref 30.0–36.0)
MCV: 92.2 fL (ref 80.0–100.0)
Monocytes Absolute: 0.6 K/uL (ref 0.1–1.0)
Monocytes Relative: 18 %
Neutro Abs: 1.3 K/uL — ABNORMAL LOW (ref 1.7–7.7)
Neutrophils Relative %: 37 %
Platelet Count: 157 K/uL (ref 150–400)
RBC: 4.11 MIL/uL (ref 3.87–5.11)
RDW: 13 % (ref 11.5–15.5)
WBC Count: 3.5 K/uL — ABNORMAL LOW (ref 4.0–10.5)
nRBC: 0 % (ref 0.0–0.2)

## 2024-09-14 LAB — CMP (CANCER CENTER ONLY)
ALT: 13 U/L (ref 0–44)
AST: 21 U/L (ref 15–41)
Albumin: 4.5 g/dL (ref 3.5–5.0)
Alkaline Phosphatase: 70 U/L (ref 38–126)
Anion gap: 9 (ref 5–15)
BUN: 16 mg/dL (ref 8–23)
CO2: 28 mmol/L (ref 22–32)
Calcium: 9.8 mg/dL (ref 8.9–10.3)
Chloride: 105 mmol/L (ref 98–111)
Creatinine: 0.85 mg/dL (ref 0.44–1.00)
GFR, Estimated: >60 mL/min (ref >60)
Glucose, Bld: 98 mg/dL (ref 70–99)
Potassium: 4.3 mmol/L (ref 3.5–5.1)
Sodium: 142 mmol/L (ref 135–145)
Total Bilirubin: 0.3 mg/dL (ref 0.0–1.2)
Total Protein: 7.1 g/dL (ref 6.5–8.1)

## 2024-09-14 LAB — IRON AND TIBC
Iron: 62 ug/dL (ref 28–170)
Saturation Ratios: 18 % (ref 10.4–31.8)
TIBC: 349 ug/dL (ref 250–450)
UIBC: 287 ug/dL

## 2024-09-14 LAB — FOLATE: Folate: >20 ng/mL (ref >5.9)

## 2024-09-14 LAB — VITAMIN B12: Vitamin B-12: 660 pg/mL (ref 180–914)

## 2024-09-14 LAB — TSH: TSH: 1.39 u[IU]/mL (ref 0.350–4.500)

## 2024-09-14 LAB — FERRITIN: Ferritin: 27 ng/mL (ref 11–307)

## 2024-09-14 LAB — LACTATE DEHYDROGENASE: LDH: 196 U/L (ref 105–235)

## 2024-09-14 NOTE — Assessment & Plan Note (Signed)
 Chronic leukopenia with a white blood cell count of 3,400, slightly below the normal range of 4,000 to 11,000.   No symptoms of infection, fever, chills, or night sweats. No recent medication changes except for colestipol for diarrhea.   No family history of autoimmune or hematological disorders.   Differential diagnosis includes benign leukopenia, characterized by preserved bone marrow function and increased white cell release during infections.   No immature or abnormal white cells observed.  No anemia or thrombocytopenia, making it less likely to be a primary bone marrow disorder.  Labs today showed white count of 3500 with ANC of 1300, normal differential.  Hemoglobin normal at 12.6, platelet count normal at 157,000.  CMP unremarkable.  Iron studies, B12, folate, TSH, LDH were all within normal limits.  Given history of chronic leukopenia, we will proceed with flow cytometry of peripheral blood analysis.  Also checked methylmalonic acid, ANA, rheumatoid factor.  - Scheduled follow-up call in two weeks with results - Scheduled follow-up appointment in four months for repeat check  She was provided reassurance.

## 2024-09-14 NOTE — Progress Notes (Signed)
 [facility]  HEMATOLOGY CLINIC CONSULTATION NOTE   PATIENT NAME: Marisa Wilson   MR#: [id number] DOB: [birth year]  DATE OF SERVICE: 09/14/2024   REFERRING PROVIDER  Dyane Anthony RAMAN, FNP   Patient Care Team: Dyane Anthony RAMAN, FNP as PCP - General (Family Medicine)   REASON FOR CONSULTATION/ CHIEF COMPLAINT: Evaluation of chronic leukopenia/neutropenia  ASSESSMENT & PLAN:  Marisa Wilson is a 73 y.o. lady with a past medical history of dyslipidemia, vitamin B12 deficiency, depression, prediabetes, GERD, was referred to our service for evaluation of chronic leukopenia/neutropenia.    Leukopenia Chronic leukopenia with a white blood cell count of 3,400, slightly below the normal range of 4,000 to 11,000.   No symptoms of infection, fever, chills, or night sweats. No recent medication changes except for colestipol for diarrhea.   No family history of autoimmune or hematological disorders.   Differential diagnosis includes benign leukopenia, characterized by preserved bone marrow function and increased white cell release during infections.   No immature or abnormal white cells observed.  No anemia or thrombocytopenia, making it less likely to be a primary bone marrow disorder.  Labs today showed white count of 3500 with ANC of 1300, normal differential.  Hemoglobin normal at 12.6, platelet count normal at 157,000.  CMP unremarkable.  Iron studies, B12, folate, TSH, LDH were all within normal limits.  Given history of chronic leukopenia, we will proceed with flow cytometry of peripheral blood analysis.  Also checked methylmalonic acid, ANA, rheumatoid factor.  - Scheduled follow-up call in two weeks with results - Scheduled follow-up appointment in four months for repeat check  She was provided reassurance.   I reviewed lab results and outside records for this visit and discussed relevant results with the patient. Diagnosis, plan of care and treatment options  were also discussed in detail with the patient. Opportunity provided to ask questions and answers provided to her apparent satisfaction. Provided instructions to call our clinic with any problems, questions or concerns prior to return visit. I recommended to continue follow-up with PCP and sub-specialists. She verbalized understanding and agreed with the plan. No barriers to learning was detected.  Chinita Patten, MD  09/14/2024 5:53 PM  [facility] - A DEPT OF [facility] [address] [place] [address] Dept: [phone] Dept Fax: [phone]   HISTORY OF PRESENT ILLNESS:  Discussed the use of AI scribe software for clinical note transcription with the patient, who gave verbal consent to proceed.  History of Present Illness Marisa Wilson is a 73 year old female who presents with a low white blood cell count for evaluation. She was referred by Anthony Dyane PIETY for evaluation of her low white blood cell count.  On 06/29/2024, labs at her PCPs office showed white count of 3400, ANC of 1200, hemoglobin normal at 13.2, MCV 91, platelet count normal at 183,000.  She was referred to [facility]  for further evaluation of leukopenia.  Review of previous records indicated white count of 3400 in September 2024.  Hemoglobin and platelet count were within normal limits at that time.  No differential is available.  She has a consistently low white blood cell count, noted to be 3,400, which is below the normal range of 4,000 to 11,000. This low count has been persistent since at least September of the previous year. No symptoms such as infections, fevers, chills, night sweats, or abdominal pain. Her appetite is good, and  she has been maintaining her weight.  Recently, her red blood cell count was also noted to be low, but her hemoglobin was within normal limits at 13.2.  She experiences occasional loose stools and a lot of gas in the  morning, but no blood or black stools. Her current medication includes colestipol for diarrhea, which has been helpful.  She does not have a significant family history of autoimmune diseases or blood disorders. She does not smoke or drink alcohol regularly, though she occasionally has a glass of wine.    MEDICAL HISTORY Past Medical History:  Diagnosis Date   Hyperlipidemia    Osteoarthritis    Prediabetes    Statin intolerance      SURGICAL HISTORY No past surgical history on file.   SOCIAL HISTORY: She reports that she has never smoked. She has never used smokeless tobacco. No history on file for alcohol use and drug use. Social History   Socioeconomic History   Marital status: Divorced    Spouse name: Not on file   Number of children: Not on file   Years of education: Not on file   Highest education level: Not on file  Occupational History   Not on file  Tobacco Use   Smoking status: Never   Smokeless tobacco: Never  Substance and Sexual Activity   Alcohol use: Not on file   Drug use: Not on file   Sexual activity: Not on file  Other Topics Concern   Not on file  Social History Narrative   Not on file   Social Drivers of Health   Financial Resource Strain: Not on file  Food Insecurity: No Food Insecurity (09/11/2024)   Hunger Vital Sign    Worried About Running Out of Food in the Last Year: Never true    Ran Out of Food in the Last Year: Never true  Transportation Needs: No Transportation Needs (09/11/2024)   PRAPARE - Administrator, Civil Service (Medical): No    Lack of Transportation (Non-Medical): No  Physical Activity: Not on file  Stress: Not on file  Social Connections: Not on file  Intimate Partner Violence: Not on file    FAMILY HISTORY: Her family history is not on file.  CURRENT MEDICATIONS   Current Outpatient Medications  Medication Instructions   ascorbic acid (VITAMIN C) 500 mg, Daily   calcium carbonate (TUMS - DOSED IN MG  ELEMENTAL CALCIUM) 500 MG chewable tablet 2 tablets, Daily at bedtime   Calcium Magnesium Zinc 333-133-5 MG TABS 2 times weekly   citalopram (CELEXA) 20 mg, Daily   colestipol (COLESTID) 1 g, Daily   cyanocobalamin  (VITAMIN B12) 500 mcg, Daily   Multiple Vitamin (MULTIVITAMIN) tablet 1 tablet, Daily   Repatha  SureClick 140 mg, Subcutaneous, Every 14 days     ALLERGIES  She is allergic to codeine and statins.  REVIEW OF SYSTEMS:  Review of Systems - Oncology   Rest of the pertinent review of systems is unremarkable except as mentioned above in HPI.  PHYSICAL EXAMINATION:    Onc Performance Status - 09/14/24 1017       ECOG Perf Status   ECOG Perf Status Fully active, able to carry on all pre-disease performance without restriction      KPS SCALE   KPS % SCORE Able to carry on normal activity, minor s/s of disease          Vitals:   09/14/24 1007 09/14/24 1009  BP: (!) 146/76 113/71  Pulse: 67  Resp: 18   Temp: 97.6 F (36.4 C)   SpO2: 98%    Filed Weights   09/14/24 1007  Weight: 186 lb 3.2 oz (84.5 kg)    Physical Exam Constitutional:      General: She is not in acute distress.    Appearance: Normal appearance.  HENT:     Head: Normocephalic and atraumatic.  Cardiovascular:     Rate and Rhythm: Normal rate.     Heart sounds: Normal heart sounds.  Pulmonary:     Effort: Pulmonary effort is normal. No respiratory distress.     Breath sounds: Normal breath sounds.  Abdominal:     General: There is no distension.  Lymphadenopathy:     Cervical: No cervical adenopathy.  Neurological:     General: No focal deficit present.     Mental Status: She is alert and oriented to person, place, and time.  Psychiatric:        Mood and Affect: Mood normal.        Behavior: Behavior normal.      LABORATORY DATA:   I have reviewed the data as listed.  Results for orders placed or performed in visit on 09/14/24  TSH  Result Value Ref Range   TSH 1.390  0.350 - 4.500 uIU/mL  Folate  Result Value Ref Range   Folate >20.0 >5.9 ng/mL  Vitamin B12  Result Value Ref Range   Vitamin B-12 660 180 - 914 pg/mL  Ferritin  Result Value Ref Range   Ferritin 27 11 - 307 ng/mL  Iron and TIBC  Result Value Ref Range   Iron 62 28 - 170 ug/dL   TIBC 650 749 - 549 ug/dL   Saturation Ratios 18 10.4 - 31.8 %   UIBC 287 ug/dL  Lactate dehydrogenase  Result Value Ref Range   LDH 196 105 - 235 U/L  CMP (Cancer Center only)  Result Value Ref Range   Sodium 142 135 - 145 mmol/L   Potassium 4.3 3.5 - 5.1 mmol/L   Chloride 105 98 - 111 mmol/L   CO2 28 22 - 32 mmol/L   Glucose, Bld 98 70 - 99 mg/dL   BUN 16 8 - 23 mg/dL   Creatinine 9.14 9.55 - 1.00 mg/dL   Calcium 9.8 8.9 - 89.6 mg/dL   Total Protein 7.1 6.5 - 8.1 g/dL   Albumin 4.5 3.5 - 5.0 g/dL   AST 21 15 - 41 U/L   ALT 13 0 - 44 U/L   Alkaline Phosphatase 70 38 - 126 U/L   Total Bilirubin 0.3 0.0 - 1.2 mg/dL   GFR, Estimated >39 >39 mL/min   Anion gap 9 5 - 15  CBC with Differential (Cancer Center Only)  Result Value Ref Range   WBC Count 3.5 (L) 4.0 - 10.5 K/uL   RBC 4.11 3.87 - 5.11 MIL/uL   Hemoglobin 12.6 12.0 - 15.0 g/dL   HCT 62.0 63.9 - 53.9 %   MCV 92.2 80.0 - 100.0 fL   MCH 30.7 26.0 - 34.0 pg   MCHC 33.2 30.0 - 36.0 g/dL   RDW 86.9 88.4 - 84.4 %   Platelet Count 157 150 - 400 K/uL   nRBC 0.0 0.0 - 0.2 %   Neutrophils Relative % 37 %   Neutro Abs 1.3 (L) 1.7 - 7.7 K/uL   Lymphocytes Relative 43 %   Lymphs Abs 1.5 0.7 - 4.0 K/uL   Monocytes Relative 18 %   Monocytes Absolute  0.6 0.1 - 1.0 K/uL   Eosinophils Relative 0 %   Eosinophils Absolute 0.0 0.0 - 0.5 K/uL   Basophils Relative 1 %   Basophils Absolute 0.0 0.0 - 0.1 K/uL   Immature Granulocytes 1 %   Abs Immature Granulocytes 0.02 0.00 - 0.07 K/uL     RADIOGRAPHIC STUDIES:  I have personally reviewed the radiological images as listed and agreed with the findings in the report.  DG BONE DENSITY  (DXA) EXAM: DUAL X-RAY ABSORPTIOMETRY (DXA) FOR BONE MINERAL DENSITY  IMPRESSION: Referring Physician:  ANTHONY GORMAN HINT Your patient completed a bone mineral density test using GE Lunar iDXA system (analysis version: 16). Technologist: KAT PATIENT: Name: Usery, Karlene D Patient ID: [id number] Birth Date: [birth year] Height: 66.7 in. Sex: Female Measured: 01/12/2024 Weight: 182.0 lbs. Indications: Advanced Age, Caucasian, Celexa, Estrogen Deficient, Family Hist. (Parent hip fracture), Postmenopausal Fractures: NONE Treatments: Calcium (E943.0), Multivitamin  ASSESSMENT: The BMD measured at Forearm Radius 33% is 0.817 g/cm2 with a T-score of -0.8. This patient is considered normal according to World Health Organization [facility]) criteria.  The quality of the exam is good. The lumbar spine was excluded due to degenerative changes and scoliosis.  Site Region Measured Date Measured Age YA BMD Significant CHANGE T-score Left Forearm Radius 33% 01/12/2024 71.2 -0.8 0.817 g/cm2  DualFemur Neck Right 01/12/2024 71.2 -0.4 0.982 g/cm2  DualFemur Total Mean 01/12/2024 71.2 0.3 1.041 g/cm2  World Health Organization [facility]) criteria for post-menopausal, Caucasian Women: Normal       T-score at or above -1 SD Osteopenia   T-score between -1 and -2.5 SD Osteoporosis T-score at or below -2.5 SD  RECOMMENDATION: 1. All patients should optimize calcium and vitamin D intake. 2. Consider FDA-approved medical therapies in postmenopausal women and men aged 19 years and older, based on the following: a. A hip or vertebral (clinical or morphometric) fracture. b. T-score = -2.5 at the femoral neck or spine after appropriate evaluation to exclude secondary causes. c. Low bone mass (T-score between -1.0 and -2.5 at the femoral neck or spine) and a 10-year probability of a hip fracture = 3% or a 10-year probability of a major osteoporosis-related fracture = 20% based on the [place] -adapted WHO algorithm. d.  Clinician judgment and/or patient preferences may indicate treatment for people with 10-year fracture probabilities above or below these levels.  FOLLOW-UP: Patients with diagnosis of osteoporosis or at high risk for fracture should have regular bone mineral density tests.? Patients eligible for Medicare are allowed routine testing every 2 years.? The testing frequency can be increased to one year for patients who have rapidly progressing disease, are receiving or discontinuing medical therapy to restore bone mass, or have additional risk factors.  I have reviewed this study and agree with the findings. [facility]  Electronically Signed   By: Dina  Arceo M.D.   On: 01/12/2024 08:56   Orders Placed This Encounter  Procedures   CBC with Differential (Cancer Center Only)    Standing Status:   Future    Number of Occurrences:   1    Expiration Date:   09/14/2025   CMP (Cancer Center only)    Standing Status:   Future    Number of Occurrences:   1    Expiration Date:   09/14/2025   Lactate dehydrogenase    Standing Status:   Future    Number of Occurrences:   1    Expiration Date:   09/14/2025   Iron and TIBC  Standing Status:   Future    Number of Occurrences:   1    Expiration Date:   09/14/2025   Ferritin    Standing Status:   Future    Number of Occurrences:   1    Expiration Date:   09/14/2025   Vitamin B12    Standing Status:   Future    Number of Occurrences:   1    Expiration Date:   09/14/2025   Folate    Standing Status:   Future    Number of Occurrences:   1    Expiration Date:   09/14/2025   TSH    Standing Status:   Future    Number of Occurrences:   1    Expiration Date:   09/14/2025   Methylmalonic acid, serum    Standing Status:   Future    Number of Occurrences:   1    Expiration Date:   09/14/2025   ANA w/Reflex if Positive    Standing Status:   Future    Number of Occurrences:   1    Expiration Date:   09/14/2025   Flow Cytometry,  Peripheral Blood (Oncology)    Leukopenia/ neutropenia. Please run leukemia panel.    Standing Status:   Future    Number of Occurrences:   1    Expiration Date:   09/14/2025   Rheumatoid factor    Standing Status:   Future    Number of Occurrences:   1    Expiration Date:   09/14/2025    Future Appointments  Date Time Provider Department Center  09/28/2024 12:30 PM Letasha Kershaw, Chinit[place], MD [facility] None  01/11/2025 11:00 AM [facility] PHLEBOTOMIST [facility] None  01/11/2025 11:30 AM Emilie Carp, Chinita, MD [facility] None    I spent a total of 55 minutes during this encounter with the patient including review of chart and various tests results, discussions about plan of care and coordination of care plan.  This document was completed utilizing speech recognition software. Grammatical errors, random word insertions, pronoun errors, and incomplete sentences are an occasional consequence of this system due to software limitations, ambient noise, and hardware issues. Any formal questions or concerns about the content, text or information contained within the body of this dictation should be directly addressed to the provider for clarification.

## 2024-09-15 LAB — ANA W/REFLEX IF POSITIVE: Anti Nuclear Antibody (ANA): NEGATIVE

## 2024-09-15 LAB — RHEUMATOID FACTOR: Rheumatoid fact SerPl-aCnc: <10 [IU]/mL (ref <14.0)

## 2024-09-16 LAB — METHYLMALONIC ACID, SERUM: Methylmalonic Acid, Quantitative: 167 nmol/L (ref 0–378)

## 2024-09-16 LAB — SURGICAL PATHOLOGY

## 2024-09-17 LAB — FLOW CYTOMETRY

## 2024-09-18 DIAGNOSIS — L03213 Periorbital cellulitis: Secondary | ICD-10-CM | POA: Diagnosis not present

## 2024-09-21 DIAGNOSIS — H00024 Hordeolum internum left upper eyelid: Secondary | ICD-10-CM | POA: Diagnosis not present

## 2024-09-28 ENCOUNTER — Encounter: Payer: Self-pay | Admitting: Oncology

## 2024-09-28 ENCOUNTER — Inpatient Hospital Stay: Admitting: Oncology

## 2024-09-28 DIAGNOSIS — D72819 Decreased white blood cell count, unspecified: Secondary | ICD-10-CM

## 2024-09-28 NOTE — Progress Notes (Signed)
 "  [facility]  HEMATOLOGY-ONCOLOGY ELECTRONIC VISIT PROGRESS NOTE  PATIENT NAME: Marisa Wilson   MR#: [id number] DOB: [birth year]  DATE OF SERVICE: 09/28/2024  Patient Care Team: Dyane Anthony RAMAN, FNP as PCP - General (Family Medicine)  I connected with the patient via telephone conference and verified that I am speaking with the correct person using two identifiers. The patient's location is at home and I am providing care from the [facility].  I discussed the limitations, risks, security and privacy concerns of performing an evaluation and management service by e-visits and the availability of in person appointments. I also discussed with the patient that there may be a patient responsible charge related to this service. The patient expressed understanding and agreed to proceed.   ASSESSMENT & PLAN:   Marisa Wilson is a 73 y.o. lady with a past medical history of dyslipidemia, vitamin B12 deficiency, depression, prediabetes, GERD, was referred to our service for evaluation of chronic leukopenia/neutropenia.  Grossly negative hematological workup.  Likely benign leukopenia.  Leukopenia Chronic leukopenia with a white blood cell count of 3,400, slightly below the normal range of 4,000 to 11,000.   No symptoms of infection, fever, chills, or night sweats. No recent medication changes except for colestipol for diarrhea.   No family history of autoimmune or hematological disorders.   No immature or abnormal white cells observed.  No anemia or thrombocytopenia, making it less likely to be a primary bone marrow disorder.   On her consultation with us  on 09/14/2024, labs showed white count of 3500 with ANC of 1300, normal differential.  Hemoglobin normal at 12.6, platelet count normal at 157,000.  CMP unremarkable.  Iron studies, B12, folate, vitamin B12, methylmalonic acid, TSH, LDH were all within normal limits.  ANA and rheumatoid factor were negative.  Flow cytometry of  peripheral blood was unremarkable.   Overall picture consistent with benign leukopenia.  Will continue surveillance alone.  She was provided reassurance. Instructed that further workup, including bone marrow biopsy, would be indicated only if white blood cell count drops below 2,000/L or if other cytopenias develop.  Currently no indication for this.  RTC in April 2026 as scheduled.   I discussed the assessment and treatment plan with the patient. The patient was provided an opportunity to ask questions and all were answered. The patient agreed with the plan and demonstrated an understanding of the instructions. The patient was advised to call back or seek an in-person evaluation if the symptoms worsen or if the condition fails to improve as anticipated.    I spent 14 minutes over the phone with the patient reviewing test results, discuss management and coordination/planning of care.  Chinita Patten, MD 09/28/2024 12:31 PM [facility] - A DEPT OF [facility] [address] [place] [address] Dept: [phone] Dept Fax: [phone]   INTERVAL HISTORY:  Please see above for problem oriented charting.  The purpose of today's discussion is to explain recent lab results and to formulate plan of care.  Discussed the use of AI scribe software for clinical note transcription with the patient, who gave verbal consent to proceed.  History of Present Illness Marisa Wilson is a 73 year old female with chronic benign leukopenia who presents for hematology follow-up and review of recent laboratory results.  Two weeks ago, she developed periorbital cellulitis, which resolved following treatment and has not recurred.  Recent laboratory evaluation revealed a white blood  cell count of 3,500, with normal hemoglobin, red blood cell count, and platelets. Peripheral smear showed no immature cells. Comprehensive metabolic panel,  iron studies, vitamin B12, folic acid , and thyroid  function tests were within normal limits. Autoimmune markers, including ANA and rheumatoid factor, were negative. Bone marrow function tests showed no evidence of leukemia or lymphoma. Absolute immature granulocytes were within normal range.   SUMMARY OF HEMATOLOGY HISTORY:  She was referred by Anthony Dyane PIETY for evaluation of her low white blood cell count.   On 06/29/2024, labs at her PCPs office showed white count of 3400, ANC of 1200, hemoglobin normal at 13.2, MCV 91, platelet count normal at 183,000.  She was referred to us  for further evaluation of leukopenia.   Review of previous records indicated white count of 3400 in September 2024.  Hemoglobin and platelet count were within normal limits at that time.  No differential is available.   She has a consistently low white blood cell count, noted to be 3,400, which is below the normal range of 4,000 to 11,000. This low count has been persistent since at least September of the previous year. No symptoms such as infections, fevers, chills, night sweats, or abdominal pain. Her appetite is good, and she has been maintaining her weight.   Recently, her red blood cell count was also noted to be low, but her hemoglobin was within normal limits at 13.2.   She experiences occasional loose stools and a lot of gas in the morning, but no blood or black stools. Her current medication includes colestipol for diarrhea, which has been helpful.   She does not have a significant family history of autoimmune diseases or blood disorders. She does not smoke or drink alcohol regularly, though she occasionally has a glass of wine.  No family history of autoimmune or hematological disorders.     No immature or abnormal white cells observed.  No anemia or thrombocytopenia, making it less likely to be a primary bone marrow disorder.   On her consultation with us  on 09/14/2024, labs showed white count of 3500 with  ANC of 1300, normal differential.  Hemoglobin normal at 12.6, platelet count normal at 157,000.  CMP unremarkable.  Iron studies, B12, folate, vitamin B12, methylmalonic acid, TSH, LDH were all within normal limits.  ANA and rheumatoid factor were negative.  Flow cytometry of peripheral blood was unremarkable.  Overall picture consistent with benign leukopenia.  Will continue surveillance alone.  She was provided reassurance.   REVIEW OF SYSTEMS:    Review of Systems - Oncology  All other pertinent systems were reviewed with the patient and are negative.  I have reviewed the past medical history, past surgical history, social history and family history with the patient and they are unchanged from previous note.  ALLERGIES:  She is allergic to codeine and statins.  MEDICATIONS:  Current Outpatient Medications  Medication Sig Dispense Refill   ascorbic acid (VITAMIN C) 500 MG tablet Take 500 mg by mouth daily.     calcium carbonate (TUMS - DOSED IN MG ELEMENTAL CALCIUM) 500 MG chewable tablet Chew 2 tablets by mouth at bedtime.     Calcium Magnesium Zinc 333-133-5 MG TABS Take by mouth 2 (two) times a week.     citalopram (CELEXA) 20 MG tablet Take 20 mg by mouth daily.     colestipol (COLESTID) 1 g tablet Take 1 g by mouth daily.     cyanocobalamin  (VITAMIN B12) 500 MCG tablet Take 500 mcg by mouth daily.  Evolocumab  (REPATHA  SURECLICK) 140 MG/ML SOAJ Inject 140 mg into the skin every 14 (fourteen) days. 6 mL 3   Multiple Vitamin (MULTIVITAMIN) tablet Take 1 tablet by mouth daily.     No current facility-administered medications for this visit.    PHYSICAL EXAMINATION:  Not performed today as it was a phone only visit  LABORATORY DATA:   I have reviewed the data as listed.  Recent Results (from the past 2160 hours)  Surgical pathology     Status: None   Collection Time: 09/14/24 12:00 AM  Result Value Ref Range   SURGICAL PATHOLOGY      Surgical Pathology CASE:  [id number] PATIENT: Marisa Wilson Flow Pathology Report     Clinical history: Leukopenia, unspecified type     DIAGNOSIS:  Peripheral blood, flow cytometry: -  No immunophenotypic evidence of a lymphoproliferative disorder (i.e. no monoclonal B cells or immunophenotypically abnormal T cells detected).  GATING AND PHENOTYPIC ANALYSIS:  Gated population: Flow cytometric immunophenotyping is performed using antibodies to the antigens listed in the table below. Electronic gates are placed around a cell cluster displaying light scatter properties corresponding to: lymphocytes  Abnormal Cells in gated population: N/A  Phenotype of Abnormal Cells: N/A                      Lymphoid Antigens       Myeloid Antigens Miscellaneous CD2  tested    CD10 tested    CD11b     ND   CD45 tested CD3  tested    CD19 tested    CD11c     ND   HLA-Dr    ND CD4  tested    CD20 tested    CD13 ND   CD34 tested CD5  tested    CD22 ND   CD14 ND   CD38  tested CD7  tested    CD79b     ND   CD15 ND   CD138     ND CD8  tested    CD103     ND   CD16 ND   TdT  ND CD25 ND   CD200     tested    CD33 ND   CD123     ND TCRab     ND   sKappa    tested    CD64 ND   CD41 ND TCRgd     tested    sLambda   tested    CD117     ND   CD61 ND CD56 tested    cKappa    ND   MPO  ND   CD71 ND CD57 ND   cLambda   ND        [id number]        GROSS DESCRIPTION:  One lavender top tube submitted from [facility] for lymphoma testing.    Final Diagnosis performed by Mark LeGolvan DO.   Electronically signed 09/16/2024 Technical and / or Professional components performed at [facility], [address], [place], [place] [address].  The above tests were developed and their performance characteristics determined by the [facility] system for the physical and immunophenotypic characterization of cell populations. They have not been cleared by the U.S. Food and Drug administration. The  FDA  has determined that such  clearance or approval is not necessary. This test is used for clinical purposes. It should not be  regarded as investigational or for research   Rheumatoid factor  Status: None   Collection Time: 09/14/24 11:47 AM  Result Value Ref Range   Rheumatoid fact SerPl-aCnc <10.0 <14.0 IU/mL    Comment: (NOTE) Performed At: [facility] [address] [place], [place] [id number] Jennette Shorter MD Ey:[id number]   Flow Cytometry, Peripheral Blood (Oncology)     Status: None   Collection Time: 09/14/24 11:47 AM  Result Value Ref Range   Flow Cytometry SEE SEPARATE REPORT     Comment: Performed at [facility], [address] [place], [place] [address]  ANA w/Reflex if Positive     Status: None   Collection Time: 09/14/24 11:47 AM  Result Value Ref Range   Anti Nuclear Antibody (ANA) Negative Negative    Comment: (NOTE) Performed At: [facility] [address] [place], [place] [id number] Jennette Shorter MD Ey:[id number]   Methylmalonic acid, serum     Status: None   Collection Time: 09/14/24 11:47 AM  Result Value Ref Range   Methylmalonic Acid, Quantitative 167 0 - 378 nmol/L    Comment: (NOTE) This test was developed and its performance characteristics determined by [facility]. It has not been cleared or approved by the Food and Drug Administration. Performed At: [facility] [address] [place], [place] [id number] Jennette Shorter MD Ey:[id number]   TSH     Status: None   Collection Time: 09/14/24 11:47 AM  Result Value Ref Range   TSH 1.390 0.350 - 4.500 uIU/mL    Comment: Performed at [facility], [address], [place], [place] [address]  Folate     Status: None   Collection Time: 09/14/24 11:47 AM  Result Value Ref Range   Folate >20.0 >5.9 ng/mL    Comment: Performed at [facility], [address] [place], [place] [address]  Vitamin B12     Status: None   Collection  Time: 09/14/24 11:47 AM  Result Value Ref Range   Vitamin B-12 660 180 - 914 pg/mL    Comment: (NOTE) This assay is not validated for testing neonatal or myeloproliferative syndrome specimens for Vitamin B12 levels. Performed at [facility], [address] [place], [place] [address]   Ferritin     Status: None   Collection Time: 09/14/24 11:47 AM  Result Value Ref Range   Ferritin 27 11 - 307 ng/mL    Comment: Performed at [facility], [address] [place], [place] [address]  Iron and TIBC     Status: None   Collection Time: 09/14/24 11:47 AM  Result Value Ref Range   Iron 62 28 - 170 ug/dL   TIBC 650 749 - 549 ug/dL   Saturation Ratios 18 10.4 - 31.8 %   UIBC 287 ug/dL    Comment: Performed at [facility], [address], [place], [place] [address]  Lactate dehydrogenase     Status: None   Collection Time: 09/14/24 11:47 AM  Result Value Ref Range   LDH 196 105 - 235 U/L    Comment: Please note change in reference range. Performed at [facility], [address] [place], [place] [address]   CMP (Cancer Center only)     Status: None   Collection Time: 09/14/24 11:47 AM  Result Value Ref Range   Sodium 142 135 - 145 mmol/L   Potassium 4.3 3.5 - 5.1 mmol/L   Chloride 105 98 - 111 mmol/L   CO2 28 22 - 32 mmol/L   Glucose, Bld 98 70 -  99 mg/dL    Comment: Glucose reference range applies only to samples taken after fasting for at least 8 hours.   BUN 16 8 - 23 mg/dL   Creatinine 9.14 9.55 - 1.00 mg/dL   Calcium 9.8 8.9 - 89.6 mg/dL   Total Protein 7.1 6.5 - 8.1 g/dL   Albumin 4.5 3.5 - 5.0 g/dL   AST 21 15 - 41 U/L   ALT 13 0 - 44 U/L   Alkaline Phosphatase 70 38 - 126 U/L   Total Bilirubin 0.3 0.0 - 1.2 mg/dL   GFR, Estimated >39 >39 mL/min    Comment: (NOTE) Calculated using the CKD-EPI Creatinine Equation (2021)    Anion gap 9 5 - 15    Comment: Performed at [facility], [address], [place], [place] [address]  CBC with Differential (Cancer Center Only)     Status: Abnormal   Collection Time: 09/14/24 11:47 AM  Result Value Ref Range   WBC Count 3.5 (L) 4.0 - 10.5 K/uL   RBC 4.11 3.87 - 5.11 MIL/uL   Hemoglobin 12.6 12.0 - 15.0 g/dL   HCT 62.0 63.9 - 53.9 %   MCV 92.2 80.0 - 100.0 fL   MCH 30.7 26.0 - 34.0 pg   MCHC 33.2 30.0 - 36.0 g/dL   RDW 86.9 88.4 - 84.4 %   Platelet Count 157 150 - 400 K/uL   nRBC 0.0 0.0 - 0.2 %   Neutrophils Relative % 37 %   Neutro Abs 1.3 (L) 1.7 - 7.7 K/uL   Lymphocytes Relative 43 %   Lymphs Abs 1.5 0.7 - 4.0 K/uL   Monocytes Relative 18 %   Monocytes Absolute 0.6 0.1 - 1.0 K/uL   Eosinophils Relative 0 %   Eosinophils Absolute 0.0 0.0 - 0.5 K/uL   Basophils Relative 1 %   Basophils Absolute 0.0 0.0 - 0.1 K/uL   Immature Granulocytes 1 %   Abs Immature Granulocytes 0.02 0.00 - 0.07 K/uL    Comment: Performed at [facility], [address] [place], [place] [address]     RADIOGRAPHIC STUDIES:  No recent pertinent imaging studies available to review.  Orders Placed This Encounter  Procedures   CBC with Differential (Cancer Center Only)    Standing Status:   Future    Expiration Date:   09/28/2025     Future Appointments  Date Time Provider Department Center  01/11/2025 11:00 AM [facility] PHLEBOTOMIST [facility] None  01/11/2025 11:30 AM Joesph Marcy, Chinita, MD [facility] None    This document was completed utilizing speech recognition software. Grammatical errors, random word insertions, pronoun errors, and incomplete sentences are an occasional consequence of this system due to software limitations, ambient noise, and hardware issues. Any formal questions or concerns about the content, text or information contained within the body of this dictation should be directly addressed to the provider for clarification.  "

## 2024-09-28 NOTE — Assessment & Plan Note (Addendum)
 Chronic leukopenia with a white blood cell count of 3,400, slightly below the normal range of 4,000 to 11,000.   No symptoms of infection, fever, chills, or night sweats. No recent medication changes except for colestipol for diarrhea.   No family history of autoimmune or hematological disorders.   No immature or abnormal white cells observed.  No anemia or thrombocytopenia, making it less likely to be a primary bone marrow disorder.   On her consultation with us  on 09/14/2024, labs showed white count of 3500 with ANC of 1300, normal differential.  Hemoglobin normal at 12.6, platelet count normal at 157,000.  CMP unremarkable.  Iron studies, B12, folate, vitamin B12, methylmalonic acid, TSH, LDH were all within normal limits.  ANA and rheumatoid factor were negative.  Flow cytometry of peripheral blood was unremarkable.   Overall picture consistent with benign leukopenia.  Will continue surveillance alone.  She was provided reassurance. Instructed that further workup, including bone marrow biopsy, would be indicated only if white blood cell count drops below 2,000/L or if other cytopenias develop.  Currently no indication for this.  RTC in April 2026 as scheduled.

## 2025-01-11 ENCOUNTER — Inpatient Hospital Stay

## 2025-01-11 ENCOUNTER — Inpatient Hospital Stay: Admitting: Oncology
